# Patient Record
Sex: Female | Born: 1985 | Hispanic: No | Marital: Married | Smoking: Never smoker
Health system: Southern US, Community
[De-identification: ages and names within clinical notes are randomized; demographics above are authoritative.]

## PROBLEM LIST (undated history)

## (undated) ENCOUNTER — Inpatient Hospital Stay (HOSPITAL_COMMUNITY): Payer: Self-pay

## (undated) DIAGNOSIS — O4690 Antepartum hemorrhage, unspecified, unspecified trimester: Secondary | ICD-10-CM

## (undated) HISTORY — PX: WISDOM TOOTH EXTRACTION: SHX21

---

## 2007-11-24 HISTORY — PX: LEEP: SHX91

## 2014-09-12 LAB — OB RESULTS CONSOLE HIV ANTIBODY (ROUTINE TESTING): HIV: NONREACTIVE

## 2014-09-12 LAB — OB RESULTS CONSOLE HEPATITIS B SURFACE ANTIGEN: Hepatitis B Surface Ag: NEGATIVE

## 2014-09-12 LAB — OB RESULTS CONSOLE RUBELLA ANTIBODY, IGM: Rubella: IMMUNE

## 2014-09-26 LAB — OB RESULTS CONSOLE GC/CHLAMYDIA
Chlamydia: NEGATIVE
Gonorrhea: NEGATIVE

## 2014-11-10 ENCOUNTER — Inpatient Hospital Stay (HOSPITAL_COMMUNITY): Payer: 59

## 2014-11-10 ENCOUNTER — Encounter (HOSPITAL_COMMUNITY): Payer: Self-pay | Admitting: Obstetrics and Gynecology

## 2014-11-10 ENCOUNTER — Inpatient Hospital Stay (HOSPITAL_COMMUNITY)
Admission: AD | Admit: 2014-11-10 | Discharge: 2014-11-10 | Disposition: A | Discharge status: Home / Self Care | Payer: 59 | Source: Ambulatory Visit | Attending: Obstetrics & Gynecology | Admitting: Obstetrics & Gynecology

## 2014-11-10 DIAGNOSIS — Z3A15 15 weeks gestation of pregnancy: Secondary | ICD-10-CM | POA: Insufficient documentation

## 2014-11-10 DIAGNOSIS — O4412 Placenta previa with hemorrhage, second trimester: Secondary | ICD-10-CM | POA: Insufficient documentation

## 2014-11-10 DIAGNOSIS — O4402 Placenta previa specified as without hemorrhage, second trimester: Secondary | ICD-10-CM | POA: Insufficient documentation

## 2014-11-10 DIAGNOSIS — O4690 Antepartum hemorrhage, unspecified, unspecified trimester: Secondary | ICD-10-CM

## 2014-11-10 DIAGNOSIS — O209 Hemorrhage in early pregnancy, unspecified: Secondary | ICD-10-CM | POA: Diagnosis present

## 2014-11-10 HISTORY — DX: Antepartum hemorrhage, unspecified, unspecified trimester: O46.90

## 2014-11-10 LAB — CBC
HCT: 39 % (ref 36.0–46.0)
Hemoglobin: 13.7 g/dL (ref 12.0–15.0)
MCH: 30.4 pg (ref 26.0–34.0)
MCHC: 35.1 g/dL (ref 30.0–36.0)
MCV: 86.7 fL (ref 78.0–100.0)
PLATELETS: 197 10*3/uL (ref 150–400)
RBC: 4.5 MIL/uL (ref 3.87–5.11)
RDW: 13.1 % (ref 11.5–15.5)
WBC: 7 10*3/uL (ref 4.0–10.5)

## 2014-11-10 LAB — ABO/RH: ABO/RH(D): B NEG

## 2014-11-10 MED ORDER — RHO D IMMUNE GLOBULIN 1500 UNIT/2ML IJ SOSY
300.0000 ug | PREFILLED_SYRINGE | Freq: Once | INTRAMUSCULAR | Status: AC
  Administered 2014-11-10: 300 ug via INTRAMUSCULAR
  Filled 2014-11-10: qty 2

## 2014-11-10 NOTE — Discharge Instructions (Signed)
Vaginal Bleeding During Pregnancy, Second Trimester  A small amount of bleeding (spotting) from the vagina is common in pregnancy. Sometimes the bleeding is normal and is not a problem, and sometimes it is a sign of something serious. Be sure to tell your doctor about any bleeding from your vagina right away. HOME CARE 1. Watch your condition for any changes. 2. Follow your doctor's instructions about how active you can be. 3. If you are on bed rest: 1. You may need to stay in bed and only get up to use the bathroom. 2. You may be allowed to do some activities. 3. If you need help, make plans for someone to help you. 4. Write down: 1. The number of pads you use each day. 2. How often you change pads. 3. How soaked (saturated) your pads are. 5. Do not use tampons. 6. Do not douche. 7. Do not have sex or orgasms until your doctor says it is okay. 8. If you pass any tissue from your vagina, save the tissue so you can show it to your doctor. 9. Only take medicines as told by your doctor. 10. Do not take aspirin because it can make you bleed. 11. Do not exercise, lift heavy weights, or do any activities that take a lot of energy and effort unless your doctor says it is okay. 12. Keep all follow-up visits as told by your doctor. GET HELP IF:   You bleed from your vagina.  You have cramps.  You have labor pains.  You have a fever that does not go away after you take medicine. GET HELP RIGHT AWAY IF:  You have very bad cramps in your back or belly (abdomen).  You have contractions.  You have chills.  You pass large clots or tissue from your vagina.  You bleed more.  You feel light-headed or weak.  You pass out (faint).  You are leaking fluid or have a gush of fluid from your vagina. MAKE SURE YOU:  Understand these instructions.  Will watch your condition.  Will get help right away if you are not doing well or get worse. Document Released: 03/26/2014 Document Reviewed:  07/17/2013 [facility] Patient Information 2015 [place]. This information is not intended to replace advice given to you by your health care provider. Make sure you discuss any questions you have with your health care provider.  Pelvic Rest Pelvic rest is sometimes recommended for women when:  13. The placenta is partially or completely covering the opening of the cervix (placenta previa). 14. There is bleeding between the uterine wall and the amniotic sac in the first trimester (subchorionic hemorrhage). 15. The cervix begins to open without labor starting (incompetent cervix, cervical insufficiency). 76. The labor is too early (preterm labor). HOME CARE INSTRUCTIONS  Do not have sexual intercourse, stimulation, or an orgasm.  Do not use tampons, douche, or put anything in the vagina.  Do not lift anything over 10 pounds (4.5 kg).  Avoid strenuous activity or straining your pelvic muscles. SEEK MEDICAL CARE IF:  You have any vaginal bleeding during pregnancy. Treat this as a potential emergency.  You have cramping pain felt low in the stomach (stronger than menstrual cramps).  You notice vaginal discharge (watery, mucus, or bloody).  You have a low, dull backache.  There are regular contractions or uterine tightening. SEEK IMMEDIATE MEDICAL CARE IF: You have vaginal bleeding and have placenta previa.  Document Released: 03/06/2011 Document Revised: 02/01/2012 Document Reviewed: 03/06/2011 Exit[employer] Patient Information 2015  [employer]. This information is not intended to replace advice given to you by your health care provider. Make sure you discuss any questions you have with your health care provider.  Placenta Previa  Placenta previa is a condition in pregnant women where the placenta implants in the lower part of the uterus. The placenta either partially or completely covers the opening to the cervix. This is a problem because the baby must pass through the cervix  during delivery. There are three types of placenta previa. They include:  17. Marginal placenta previa. The placenta is near the cervix, but does not cover the opening. 18. Partial placenta previa. The placenta covers part of the cervical opening. 19. Complete placenta previa. The placenta covers the entire cervical opening.  Depending on the type of placenta previa, there is a chance the placenta may move into a normal position and no longer cover the cervix as the pregnancy progresses. It is important to keep all prenatal visits with your caregiver.  RISK FACTORS You may be more likely to develop placenta previa if you:   Are carrying more than one baby (multiples).   Have an abnormally shaped uterus.   Have scars on the lining of the uterus.   Had previous surgeries involving the uterus, such as a cesarean delivery.   Have delivered a baby previously.   Have a history of placenta previa.   Have smoked or used cocaine during pregnancy.   Are age 31 or older during pregnancy.  SYMPTOMS The main symptom of placenta previa is sudden, painless vaginal bleeding during the second half of pregnancy. The amount of bleeding can be light to very heavy. The bleeding may stop on its own, but almost always returns. Cramping, regular contractions, abdominal pain, and lower back pain can also occur with placenta previa.  DIAGNOSIS Placenta previa can be diagnosed through an ultrasound by finding where the placenta is located. The ultrasound may find placenta previa either during a routine prenatal visit or after vaginal bleeding is noticed. If you are diagnosed with placenta previa, your caregiver may avoid vaginal exams to reduce the risk of heavy bleeding. There is a chance that placenta previa may not be diagnosed until bleeding occurs during labor.  TREATMENT Specific treatment depends on:   How much you are bleeding or if the bleeding has stopped.  How far along you are in your  pregnancy.   The condition of the baby.   The location of the baby and placenta.   The type of placenta previa.  Depending on the factors above, your caregiver may recommend:   Decreased activity.   Bed rest at home or in the hospital.  Pelvic rest. This means no sex, using tampons, douching, pelvic exams, or placing anything into the vagina.  A blood transfusion to replace maternal blood loss.  A cesarean delivery if the bleeding is heavy and cannot be controlled or the placenta completely covers the cervix.  Medication to stop premature labor or mature the fetal lungs if delivery is needed before the pregnancy is full term.  WHEN SHOULD YOU SEEK IMMEDIATE MEDICAL CARE IF YOU ARE SENT HOME WITH PLACENTA PREVIA? Seek immediate medical care if you show any symptoms of placenta previa. You will need to go to the hospital to get checked immediately. Again, those symptoms are:  Sudden, painless vaginal bleeding, even a small amount.  Cramping or regular contractions.  Lower back or abdominal pain. Document Released: 11/09/2005 Document Revised: 07/12/2013 Document Reviewed: 02/10/2013  Exit[employer]® Patient Information ©2015 [employer]. This information is not intended to replace advice given to you by your health care provider. Make sure you discuss any questions you have with your health care provider. ° °

## 2014-11-10 NOTE — MAU Note (Signed)
Pt states here for heavy bleeding that began 1 hour ago. Was at office this past week, has 2nd u/s, fht's heard. Began bleeding during intercourse. Denies pain. Unsure if still bleeding at present.

## 2014-11-10 NOTE — MAU Provider Note (Signed)
History     CSN: [id number]  Arrival date and time: 11/10/14 9211  Provider on unit: 0945 Provider at bedside: 1018     Chief Complaint  Patient presents with  . Vaginal Bleeding   HPI  [name] is a 28 yo G1P0 at 15.[redacted] wks gestation presenting with bright, red VB during intercourse.  She has a known history of complete previa that she thought was just "low-lying and would be monitored". Her blood type is B negative (per [facility] records) and will need Rhophylac injection.  Her prenatal hx is significant for Rh Negative and complete placenta previa.  She has had a normal Ultrascreen and NT sono.  Her primary OB at [facility] is Dr. Benjie Dickson.  Past Medical History  Diagnosis Date  . Vaginal bleeding during pregnancy, antepartum 11/10/2014    Past Surgical History  Procedure Laterality Date  . Leep  2009    History reviewed. No pertinent family history.  History  Substance Use Topics  . Smoking status: Never Smoker   . Smokeless tobacco: Never Used  . Alcohol Use: No    Allergies: No Known Allergies  No prescriptions prior to admission    Review of Systems  Constitutional: Negative.   HENT: Negative.   Eyes: Negative.   Respiratory: Negative.   Cardiovascular: Negative.   Gastrointestinal: Negative.   Genitourinary:       Vaginal bleeding during intercourse; decreased in amount since being in hospital  Musculoskeletal: Negative.   Skin: Negative.   Neurological: Negative.   Endo/Heme/Allergies: Negative.   Psychiatric/Behavioral: Negative.    Results for orders placed or performed during the hospital encounter of 11/10/14 (from the past 24 hour(s))  CBC     Status: None   Collection Time: 11/10/14  9:05 AM  Result Value Ref Range   WBC 7.0 4.0 - 10.5 K/uL   RBC 4.50 3.87 - 5.11 MIL/uL   Hemoglobin 13.7 12.0 - 15.0 g/dL   HCT 39.0 36.0 - 46.0 %   MCV 86.7 78.0 - 100.0 fL   MCH 30.4 26.0 - 34.0 pg   MCHC 35.1 30.0 - 36.0 g/dL   RDW 13.1 11.5 - 15.5 %   Platelets 197 150 - 400 K/uL  Rh IG workup (includes ABO/Rh)     Status: None (Preliminary result)   Collection Time: 11/10/14  9:05 AM  Result Value Ref Range   Gestational Age(Wks) 15.2    ABO/RH(D) B NEG    Antibody Screen NEG    Unit Number [id number]    Blood Component Type RHIG    Unit division 00    Status of Unit ISSUED    Transfusion Status OK TO TRANSFUSE    OB Limited Ultrasound (transcribed from preliminary report) 15.2 wks SIUP with FHR of 144 bpm, anterior placenta previa - no abruption seen, AFI WNL, CL 4.09 cm, fetal position: transverse to maternal RT  Physical Exam   Blood pressure 107/67, pulse 62, temperature 98.4 F (36.9 C), temperature source Oral, resp. rate 16, height 5\' 10"  (1.778 m), weight 65.488 kg (144 lb 6 oz).  Physical Exam  Constitutional: She is oriented to person, place, and time. She appears well-developed and well-nourished.  HENT:  Head: Normocephalic.  Eyes: Pupils are equal, round, and reactive to light.  Neck: Normal range of motion.  Cardiovascular: Normal rate, regular rhythm and normal heart sounds.   Respiratory: Effort normal and breath sounds normal.  GI: Soft. Bowel sounds are normal.  Genitourinary:  SSE: Small amount  of brownish, red blood in vaginal vault; blood gently removed with large swab; VE deferred d/t complete previa  Musculoskeletal: Normal range of motion.  Neurological: She is alert and oriented to person, place, and time. She has normal reflexes.  Skin: Skin is warm and dry.  Psychiatric: She has a normal mood and affect. Her behavior is normal. Judgment and thought content normal.    MAU Course  Procedures Fetal Heart Tones CBC OB Limited Ultrasound Rhophylac Work-up/Injection Assessment and Plan  28 yo G1P0 @ 15.[redacted] wks gestation Placenta Previa, second trimester Vaginal Bleeding in Pregnancy  Discharge Home Pelvic Rest until seen by Dr. Cranston Dickson to exercise / stop if bleeding or cramping occurs Call  the office for any bleeding or pain Keep scheduled appointment with Dr. Benjie Dickson 11/28/2014   Laury Deep, M MSN, CNM 11/10/2014, 11:48 AM

## 2014-11-11 DIAGNOSIS — O4402 Placenta previa specified as without hemorrhage, second trimester: Secondary | ICD-10-CM | POA: Insufficient documentation

## 2014-11-11 LAB — RH IG WORKUP (INCLUDES ABO/RH)
ABO/RH(D): B NEG
ANTIBODY SCREEN: NEGATIVE
Gestational Age(Wks): 15.2
Unit division: 0

## 2014-11-23 NOTE — L&D Delivery Note (Signed)
Delivery Note At 9:41 AM a viable and healthy female was delivered via Vaginal, Spontaneous Delivery (Presentation: Right Occiput Anterior).  APGAR: 9, 9; weight  -pending    Placenta status: Intact, Spontaneous.  Cord: 3 vessels with the following complications: None.  Cord pH: N/A  Anesthesia: Epidural  Episiotomy: None Lacerations: Labial Suture Repair: 3.0 vicryl rapide Est. Blood Loss (mL):  30 cc  Mom to postpartum.  Baby to Couplet care / Skin to Skin.  Amro Winebarger R 04/16/2015, 10:07 AM

## 2015-01-04 ENCOUNTER — Encounter: Payer: Self-pay | Admitting: Internal Medicine

## 2015-01-04 ENCOUNTER — Ambulatory Visit (INDEPENDENT_AMBULATORY_CARE_PROVIDER_SITE_OTHER): Payer: 59 | Admitting: Internal Medicine

## 2015-01-04 VITALS — Ht 70.0 in | Wt 154.0 lb

## 2015-01-04 DIAGNOSIS — R42 Dizziness and giddiness: Secondary | ICD-10-CM

## 2015-01-04 NOTE — Progress Notes (Signed)
Cardiology Office Note   Date:  01/04/2015   ID:  Dawn Dickson, DOB October 29, 1986, MRN [id number]  PCP:  [facility]  Cardiologist:   Dorris Carnes, MD   Patient is a 29 yo who presents for evaluation of reported "syncope"    History of Present Illness: Dawn Dickson is a 28 y.o. female who is [redacted] wks pregnant.  In first trimester she had problems with morning sickness.  One morning she was exercising at [place] started to tunnel  Saw stars  Moved from where she was to go lean against wall  Did not pass out  Denied palpitations.  Did fine after  Last week had episode of ankle edema  Denies change in fluid or salt intake prior  Resolved on own over 3 days    Was seen by OB  BP 130s/    Urine neg for protein  Blood work done   [employer] AM got up  Had breakfast  Went to 7:3- AM meeting  Was sitting  Felt SOB for about a min  Had some epigastric pain.  Then develop lightheadedness  Vision dimmed  Told person sitting next to her that she felt like she was going to pass out  Got up  Went to side and back of room Couldn't see things for a bit  Did not pass out  Went to OB later  BP was 96/48    Patient is a Chief Executive Officer  Works mainly at [employer] that she drinks fluids   Has been feeling good at other times    When young had some dizzy spells with quick standing  Saw "spots" in past  Has never passed out.  Has also had vertigo which is different         Current Outpatient Prescriptions  Medication Sig Dispense Refill  . folic acid (FOLVITE) 188 MCG tablet Take 400 mcg by mouth daily.    . Omega-3 Fatty Acids (FISH OIL PO) Take by mouth daily.    . Prenat w/o A-FeCbGl-DSS-FA-DHA (CITRANATAL 90 DHA) 90-1 & 300 MG MISC as needed.  11   No current facility-administered medications for this visit.    Allergies:   Review of patient's allergies indicates no known allergies.   Past Medical History  Diagnosis Date  . Vaginal bleeding during pregnancy,  antepartum 11/10/2014    Past Surgical History  Procedure Laterality Date  . Leep  2009     Social History:  The patient  reports that she has never smoked. She has never used smokeless tobacco. She reports that she does not drink alcohol or use illicit drugs.   Family History:  The patient's family history is not on file.    ROS:  Please see the history of present illness. All other systems are reviewed and  Negative to the above problem except as noted.    PHYSICAL EXAM: VS:  Ht 5\' 10"  (1.778 m)  Wt 154 lb (69.854 kg)  BMI 22.10 kg/m2  SpO2 97%  GEN: Well nourished, well developed, in no acute distress HEENT: normal Neck: no JVD, carotid bruits, or masses Cardiac: RRR; no murmurs, rubs, or gallops,no edema  Respiratory:  clear to auscultation bilaterally, normal work of breathing GI: soft, nontender, nondistended, + BS  No hepatomegaly  MS: no deformity Moving all extremities   Skin: warm and dry, no rash Neuro:  Strength and sensation are intact Psych: euthymic mood, full affect  EKG:  EKG is ordered today.  SR 70 bpm     Lipid Panel No results found for: CHOL, TRIG, HDL, CHOLHDL, VLDL, LDLCALC, LDLDIRECT    Wt Readings from Last 3 Encounters:  01/04/15 154 lb (69.854 kg)  11/10/14 144 lb 6 oz (65.488 kg)      ASSESSMENT AND PLAN  Dizziness  Patinet has not had syncope.  She has had 2 spells now of severe dizziness since pregnant.   On check today she is not orthostatic. I do think she has a tendency towards this though based on her history   Cardiac exam is normal  No edema EKG is normal  I would recomm that she increase her fluid and some salt intake  She needs to take activities as tolerated.  I would encourage exercise but not with excess sweating.  Again, hydrate   When at works she needs to elevate legs, change position.  Try to cut back on some of demands if possible.  IN regards to edema it was self limited  Sounds like testing was negative  Follow   No testing for now.    Will be available as needed  I have given patient my number so she can call if problems present  Overall I thinks she and the baby should do OK     Signed, Dorris Carnes, MD  01/04/2015 4:37 PM    [address], [place], [place]  [address] Phone: [phone]; Fax: [phone]

## 2015-01-04 NOTE — Patient Instructions (Signed)
Your physician recommends that you continue on your current medications as directed. Please refer to the Current Medication list given to you today.     

## 2015-01-24 ENCOUNTER — Telehealth: Payer: Self-pay | Admitting: Internal Medicine

## 2015-01-24 NOTE — Telephone Encounter (Signed)
Left msg on voicemail Seen on 2/12   Called to see how she was feeling .   Told her to call back

## 2015-02-14 LAB — OB RESULTS CONSOLE RPR: RPR: NONREACTIVE

## 2015-04-01 ENCOUNTER — Inpatient Hospital Stay (HOSPITAL_COMMUNITY): Payer: 59

## 2015-04-01 ENCOUNTER — Inpatient Hospital Stay (HOSPITAL_COMMUNITY)
Admission: AD | Admit: 2015-04-01 | Discharge: 2015-04-02 | DRG: 778 | Disposition: A | Discharge status: Home / Self Care | Payer: 59 | Source: Ambulatory Visit | Attending: Obstetrics | Admitting: Obstetrics

## 2015-04-01 ENCOUNTER — Encounter (HOSPITAL_COMMUNITY): Payer: Self-pay | Admitting: *Deleted

## 2015-04-01 DIAGNOSIS — Z87440 Personal history of urinary (tract) infections: Secondary | ICD-10-CM | POA: Diagnosis not present

## 2015-04-01 DIAGNOSIS — N1330 Unspecified hydronephrosis: Secondary | ICD-10-CM | POA: Diagnosis present

## 2015-04-01 DIAGNOSIS — O9989 Other specified diseases and conditions complicating pregnancy, childbirth and the puerperium: Secondary | ICD-10-CM | POA: Diagnosis present

## 2015-04-01 DIAGNOSIS — O4693 Antepartum hemorrhage, unspecified, third trimester: Secondary | ICD-10-CM | POA: Diagnosis present

## 2015-04-01 DIAGNOSIS — O4690 Antepartum hemorrhage, unspecified, unspecified trimester: Secondary | ICD-10-CM

## 2015-04-01 DIAGNOSIS — Z3A35 35 weeks gestation of pregnancy: Secondary | ICD-10-CM | POA: Diagnosis present

## 2015-04-01 DIAGNOSIS — N86 Erosion and ectropion of cervix uteri: Secondary | ICD-10-CM | POA: Diagnosis present

## 2015-04-01 DIAGNOSIS — O3433 Maternal care for cervical incompetence, third trimester: Secondary | ICD-10-CM | POA: Diagnosis present

## 2015-04-01 LAB — COMPREHENSIVE METABOLIC PANEL
ALT: 14 U/L (ref 14–54)
AST: 22 U/L (ref 15–41)
Albumin: 2.9 g/dL — ABNORMAL LOW (ref 3.5–5.0)
Alkaline Phosphatase: 216 U/L — ABNORMAL HIGH (ref 38–126)
Anion gap: 6 (ref 5–15)
BUN: <5 mg/dL — ABNORMAL LOW (ref 6–20)
CO2: 23 mmol/L (ref 22–32)
Calcium: 8.7 mg/dL — ABNORMAL LOW (ref 8.9–10.3)
Chloride: 104 mmol/L (ref 101–111)
Creatinine, Ser: 0.51 mg/dL (ref 0.44–1.00)
GFR calc Af Amer: >60 mL/min (ref >60)
GFR calc non Af Amer: >60 mL/min (ref >60)
Glucose, Bld: 105 mg/dL — ABNORMAL HIGH (ref 70–99)
Potassium: 3.9 mmol/L (ref 3.5–5.1)
Sodium: 133 mmol/L — ABNORMAL LOW (ref 135–145)
Total Bilirubin: 0.6 mg/dL (ref 0.3–1.2)
Total Protein: 5.8 g/dL — ABNORMAL LOW (ref 6.5–8.1)

## 2015-04-01 LAB — CBC
HCT: 38.2 % (ref 36.0–46.0)
Hemoglobin: 13.1 g/dL (ref 12.0–15.0)
MCH: 29 pg (ref 26.0–34.0)
MCHC: 34.3 g/dL (ref 30.0–36.0)
MCV: 84.5 fL (ref 78.0–100.0)
Platelets: 202 10*3/uL (ref 150–400)
RBC: 4.52 MIL/uL (ref 3.87–5.11)
RDW: 14.7 % (ref 11.5–15.5)
WBC: 9 10*3/uL (ref 4.0–10.5)

## 2015-04-01 LAB — PROTIME-INR
INR: 1.01 (ref 0.00–1.49)
Prothrombin Time: 13.4 seconds (ref 11.6–15.2)

## 2015-04-01 LAB — OB RESULTS CONSOLE GBS: GBS: NEGATIVE

## 2015-04-01 LAB — SAVE SMEAR

## 2015-04-01 LAB — FIBRINOGEN: Fibrinogen: 515 mg/dL — ABNORMAL HIGH (ref 204–475)

## 2015-04-01 LAB — APTT: aPTT: 28 seconds (ref 24–37)

## 2015-04-01 MED ORDER — FENTANYL 2.5 MCG/ML BUPIVACAINE 1/10 % EPIDURAL INFUSION (WH - ANES): 14.0000 mL/h | INTRAMUSCULAR | Status: DC | PRN

## 2015-04-01 MED ORDER — DIPHENHYDRAMINE HCL 50 MG/ML IJ SOLN: 12.5000 mg | INTRAMUSCULAR | Status: DC | PRN

## 2015-04-01 MED ORDER — LIDOCAINE HCL (PF) 1 % IJ SOLN: 30.0000 mL | INTRAMUSCULAR | Status: DC | PRN

## 2015-04-01 MED ORDER — OXYCODONE-ACETAMINOPHEN 5-325 MG PO TABS
2.0000 | ORAL_TABLET | ORAL | Status: DC | PRN
Start: 2015-04-01 — End: 2015-04-02

## 2015-04-01 MED ORDER — LACTATED RINGERS IV SOLN: INTRAVENOUS | Status: DC

## 2015-04-01 MED ORDER — LACTATED RINGERS IV SOLN: 500.0000 mL | INTRAVENOUS | Status: DC | PRN

## 2015-04-01 MED ORDER — EPHEDRINE 5 MG/ML INJ: 10.0000 mg | INTRAVENOUS | Status: DC | PRN

## 2015-04-01 MED ORDER — BETAMETHASONE SOD PHOS & ACET 6 (3-3) MG/ML IJ SUSP
12.0000 mg | Freq: Once | INTRAMUSCULAR | Status: AC
  Administered 2015-04-01: 12 mg via INTRAMUSCULAR
  Filled 2015-04-01: qty 2

## 2015-04-01 MED ORDER — ZOLPIDEM TARTRATE 5 MG PO TABS
5.0000 mg | ORAL_TABLET | Freq: Every day | ORAL | Status: DC
Start: 2015-04-01 — End: 2015-04-02
  Administered 2015-04-01: 5 mg via ORAL
  Filled 2015-04-01: qty 1

## 2015-04-01 MED ORDER — ACETAMINOPHEN 325 MG PO TABS: 650.0000 mg | ORAL_TABLET | ORAL | Status: DC | PRN

## 2015-04-01 MED ORDER — PHENYLEPHRINE 40 MCG/ML (10ML) SYRINGE FOR IV PUSH (FOR BLOOD PRESSURE SUPPORT): 80.0000 ug | PREFILLED_SYRINGE | INTRAVENOUS | Status: DC | PRN

## 2015-04-01 MED ORDER — CITRIC ACID-SODIUM CITRATE 334-500 MG/5ML PO SOLN: 30.0000 mL | ORAL | Status: DC | PRN

## 2015-04-01 MED ORDER — OXYCODONE-ACETAMINOPHEN 5-325 MG PO TABS: 1.0000 | ORAL_TABLET | ORAL | Status: DC | PRN

## 2015-04-01 MED ORDER — OXYTOCIN 40 UNITS IN LACTATED RINGERS INFUSION - SIMPLE MED: 62.5000 mL/h | INTRAVENOUS | Status: DC

## 2015-04-01 MED ORDER — OXYTOCIN BOLUS FROM INFUSION: 500.0000 mL | INTRAVENOUS | Status: DC

## 2015-04-01 NOTE — MAU Note (Signed)
Pt returned from ambulating after about 5 minutes and stated that she had some bleeding.  Passed a small clot and had blood on tissue when she wiped and on the inside of both thighs.  Put back on the monitor and informed provider.

## 2015-04-01 NOTE — H&P (Signed)
  OB ADMISSION/ HISTORY & PHYSICAL:  Admission Date: 04/01/2015 11:36 AM  Admit Diagnosis: 35.4 weeks  / late preterm labor / third trimester bleeding  Priscillia Fouch is a 29 y.o. female presenting for questionable labor with progressive cervical change since Friday  / new onset red bleeding today  Prenatal History: G1P0   EDC : 05/02/2015, Date entered prior to episode creation  Prenatal care at [facility]  Primary Ob Provider: [facility] Prenatal course complicated by hx LEEP / antepartum bleeding episode r/t early previa (resolved) / anterior previa - resolved third trimester  / short cervix with funneling @ 34 weeks  Prenatal Labs: ABO, Rh: --/--/B NEG, B NEG (12/19 0905) Antibody: NEG (12/19 0905) Rubella:   Immune RPR:   NR HBsAg:   Negative HIV:   NR GTT: NL GBS:   GBS  Urine TOC last week - negative  Medical / Surgical History : Past medical history:  Past Medical History  Diagnosis Date  . Vaginal bleeding during pregnancy, antepartum 11/10/2014    Past surgical history:  Past Surgical History  Procedure Laterality Date  . Leep  2009   Family History: History reviewed. No pertinent family history.   Social History:  reports that she has never smoked. She has never used smokeless tobacco. She reports that she does not drink alcohol or use illicit drugs.  Allergies: Review of patient's allergies indicates no known allergies.   Current Medications at time of admission:  Prior to Admission medications   Medication Sig Start Date End Date Taking? Authorizing Provider  Prenat w/o A-FeCbGl-DSS-FA-DHA (CITRANATAL 90 DHA) 90-1 & 300 MG MISC as needed. 10/10/14  Yes Historical Provider, MD  folic acid (FOLVITE) 638 MCG tablet Take 400 mcg by mouth daily.    Historical Provider, MD  Omega-3 Fatty Acids (FISH OIL PO) Take by mouth daily.    Historical Provider, MD   Review of Systems: Active FM onset of ctx currently every 3-5 minutes No LOF bloody show  present  Physical Exam: VS: Blood pressure 109/72, pulse 84, temperature 97.9 F (36.6 C), temperature source Oral, resp. rate 20, height 5' 8.5" (1.74 m), weight 73.483 kg (162 lb).  General: alert and oriented, appears comfortable Heart: RRR Lungs: Clear lung fields Abdomen: Gravid, soft and non-tender, non-distended / uterus: gravid - non-tender Extremities: no edema  Genitalia / VE:   in office 3cm / 70% / vtx / 0 station                                         4-5cm / 90% / vtx  +1 BBOW  FHR: baseline rate 145 / variability moderate / accelerations + / no decelerations TOCO: ctx Q 3-5  Assessment: 35.[redacted] weeks gestation - negative GBS Pre-term labor Third trimester bleeding - likely cervical progression / cannot exclude placental etiology FHR category 1   Plan:  Admit sono to assess placenta DIC labs with antibody screen and type&screen Initial triage orders with Dr Garwin Brothers  Dr Pamala Hurry notified of admission / plan of care with progressive cervical change  Artelia Laroche CNM, MSN, [facility] 04/01/2015, 1:13 PM

## 2015-04-01 NOTE — MAU Note (Signed)
Urine in lab 

## 2015-04-01 NOTE — MAU Note (Addendum)
PT  CAME FROM  OFFICE  FOR  LABOR  EVAL-  3CM  80  %  - GBS-  NEG

## 2015-04-01 NOTE — Progress Notes (Addendum)
Lab reports pt has positive antibody--provider notified no new orders

## 2015-04-01 NOTE — MAU Note (Signed)
29 yo G1P0 MWF @ 35 4/7 weeks hx LEEP presents for further evaluation due to cervical dilation noted in office today. GBS cx neg. ucx neg from 5/6. Last ate an apple Per Rolitta, VE 3/80/0 Tracing: baseline 150 reactive Ctx q 3-4 mins( rate 1/10)  IMP: latent phase/preterm labor @ 35 4/7 weeks Pt and husband advised would not stop labor due to gest age. Recommend ambulating and recheck cervix in one hour by Artelia Laroche, CNM Advised to d/c macrobid. Will get labor parameters prior to going home All ? answered

## 2015-04-01 NOTE — Progress Notes (Signed)
S: Pt notes still with contractions, slightly stronger but less frequent than earlier. No LOF but occ dark blood and increased mucous. Good FM.  O: Gen: well appearing Abd: gravid, fundus NT GU: cvx 4cm/80%/ vtx 0/ membranes intact/ mid position/ no active bleeding LE: NT, no edema Toco: q 4-7 min FH: 130s, + accles, no decels, 10 breat var  CBC    Component Value Date/Time   WBC 9.0 04/01/2015 1439   RBC 4.52 04/01/2015 1439   HGB 13.1 04/01/2015 1439   HCT 38.2 04/01/2015 1439   PLT 202 04/01/2015 1439   MCV 84.5 04/01/2015 1439   MCH 29.0 04/01/2015 1439   MCHC 34.3 04/01/2015 1439   RDW 14.7 04/01/2015 1439    nl DIC panel U/s: no evidence previa  A/P: 29 yo G1 at 35'4 admitted for PTL, no tocolysis given, late preterm BMZ given as no evidence chorio, no further cervical change through the day. H/o LEEP and cervical shortening/ funnelling over the last few wks. DDx: arrested PTL vs cervical incompletence with 3rd trimester vaginal bleeding likely due to cervical change. D/w pt would not tocolyze and would not augment at this point. Fetal testing remains reactive. Will treat as arrested PTL at this point, watch over night and repeat cervical exam in am. Pt understands plan.  Rhian Funari A. 04/01/2015 7:37 PM

## 2015-04-01 NOTE — MAU Note (Signed)
Sent from office for further eval , pt contracting.  Was 3/80.

## 2015-04-02 LAB — RPR: RPR Ser Ql: NONREACTIVE

## 2015-04-02 MED ORDER — BETAMETHASONE SOD PHOS & ACET 6 (3-3) MG/ML IJ SUSP
12.0000 mg | Freq: Once | INTRAMUSCULAR | Status: AC
  Administered 2015-04-02: 12 mg via INTRAMUSCULAR
  Filled 2015-04-02: qty 2

## 2015-04-02 NOTE — Discharge Instructions (Signed)
Pelvic Rest Pelvic rest is sometimes recommended for women when:   The placenta is partially or completely covering the opening of the cervix (placenta previa).  There is bleeding between the uterine wall and the amniotic sac in the first trimester (subchorionic hemorrhage).  The cervix begins to open without labor starting (incompetent cervix, cervical insufficiency).  The labor is too early (preterm labor). HOME CARE INSTRUCTIONS  Do not have sexual intercourse, stimulation, or an orgasm.  Do not use tampons, douche, or put anything in the vagina.  Do not lift anything over 10 pounds (4.5 kg).  Avoid strenuous activity or straining your pelvic muscles. SEEK MEDICAL CARE IF:  You have any vaginal bleeding during pregnancy. Treat this as a potential emergency.  You have cramping pain felt low in the stomach (stronger than menstrual cramps).  You notice vaginal discharge (watery, mucus, or bloody).  You have a low, dull backache.  There are regular contractions or uterine tightening. SEEK IMMEDIATE MEDICAL CARE IF: You have vaginal bleeding and have placenta previa.  Document Released: 03/06/2011 Document Revised: 02/01/2012 Document Reviewed: 03/06/2011 [facility] Patient Information 2015 [place], [place]. This information is not intended to replace advice given to you by your health care provider. Make sure you discuss any questions you have with your health care provider.  Vaginal Bleeding During Pregnancy, Third Trimester A small amount of bleeding (spotting) from the vagina is relatively common in pregnancy. Various things can cause bleeding or spotting in pregnancy. Sometimes the bleeding is normal and is not a problem. However, bleeding during the third trimester can also be a sign of something serious for the mother and the baby. Be sure to tell your health care provider about any vaginal bleeding right away.  Some possible causes of vaginal bleeding during the third  trimester include:   The placenta may be partially or completely covering the opening to the cervix (placenta previa).   The placenta may have separated from the uterus (abruption of the placenta).   There may be an infection or growth on the cervix.   You may be starting labor, called discharging of the mucus plug.   The placenta may grow into the muscle layer of the uterus (placenta accreta).  HOME CARE INSTRUCTIONS  Watch your condition for any changes. The following actions may help to lessen any discomfort you are feeling:   Follow your health care provider's instructions for limiting your activity. If your health care provider orders bed rest, you may need to stay in bed and only get up to use the bathroom. However, your health care provider may allow you to continue light activity.  If needed, make plans for someone to help with your regular activities and responsibilities while you are on bed rest.  Keep track of the number of pads you use each day, how often you change pads, and how soaked (saturated) they are. Write this down.  Do not use tampons. Do not douche.  Do not have sexual intercourse or orgasms until approved by your health care provider.  Follow your health care provider's advice about lifting, driving, and physical activities.  If you pass any tissue from your vagina, save the tissue so you can show it to your health care provider.   Only take over-the-counter or prescription medicines as directed by your health care provider.  Do not take aspirin because it can make you bleed.   Keep all follow-up appointments as directed by your health care provider. SEEK MEDICAL CARE IF:  You have any vaginal bleeding during any part of your pregnancy.  You have cramps or labor pains.  You have a fever, not controlled by medicine. SEEK IMMEDIATE MEDICAL CARE IF:   You have severe cramps or pain in your back or belly (abdomen).  You have chills.  You have a  gush of fluid from the vagina.  You pass large clots or tissue from your vagina.  Your bleeding increases.  You feel light-headed or weak.  You pass out.  You feel less movement or no movement of the baby.  MAKE SURE FXT:[id number] Labor Information Preterm labor is when labor starts at less than 37 weeks of pregnancy. The normal length of a pregnancy is 39 to 41 weeks. CAUSES Often, there is no identifiable underlying cause as to why a woman goes into preterm labor. One of the most common known causes of preterm labor is infection. Infections of the uterus, cervix, vagina, amniotic sac, bladder, kidney, or even the lungs (pneumonia) can cause labor to start. Other suspected causes of preterm labor include:  Urogenital infections, such as yeast infections and bacterial vaginosis.  Uterine abnormalities (uterine shape, uterine septum, fibroids, or bleeding from the placenta).  A cervix that has been operated on (it may fail to stay closed).  Malformations in the fetus.  Multiple gestations (twins, triplets, and so on).  Breakage of the amniotic sac.  RISK FACTORS Having a previous history of preterm labor.  Having premature rupture of membranes (PROM).  Having a placenta that covers the opening of the cervix (placenta previa).  Having a placenta that separates from the uterus (placental abruption).  Having a cervix that is too weak to hold the fetus in the uterus (incompetent cervix).  Having too much fluid in the amniotic sac (polyhydramnios).  Taking illegal drugs or smoking while pregnant.  Not gaining enough weight while pregnant.  Being younger than 85 and older than 29 years old.  Having a low socioeconomic status.  Being African American. SYMPTOMS Signs and symptoms of preterm labor include:  Menstrual-like cramps, abdominal pain, or back pain. Uterine contractions that are regular, as frequent as six in an hour, regardless of their intensity (may be mild or  painful). Contractions that start on the top of the uterus and spread down to the lower abdomen and back.  A sense of increased pelvic pressure.  A watery or bloody mucus discharge that comes from the vagina.  TREATMENT Depending on the length of the pregnancy and other circumstances, your health care provider may suggest bed rest. If necessary, there are medicines that can be given to stop contractions and to mature the fetal lungs. If labor happens before 34 weeks of pregnancy, a prolonged hospital stay may be recommended. Treatment depends on the condition of both you and the fetus.  WHAT SHOULD YOU DO IF YOU THINK YOU ARE IN PRETERM LABOR? Call your health care provider right away. You will need to go to the hospital to get checked immediately. HOW CAN YOU PREVENT PRETERM LABOR IN FUTURE PREGNANCIES? You should:  Stop smoking if you smoke. Maintain healthy weight gain and avoid chemicals and drugs that are not necessary. Be watchful for any type of infection. Inform your health care provider if you have a known history of preterm labor. Document Released: 01/30/2004 Document Revised: 07/12/2013 Document Reviewed: 12/12/2012 [facility] Patient Information 2015 [place], [place]. This information is not intended to replace advice given to you by your health care provider. Make sure you discuss any  questions you have with your health care provider.   Understand these instructions.  Will watch your condition.  Will get help right away if you are not doing well or get worse. Document Released: 01/30/2003 Document Revised: 11/14/2013 Document Reviewed: 07/17/2013 [facility] Patient Information 2015 [place], [place]. This information is not intended to replace advice given to you by your health care provider. Make sure you discuss any questions you have with your health care provider.

## 2015-04-02 NOTE — Discharge Summary (Signed)
Patient ID: Dawn Dickson MRN: [id number] DOB/AGE: 12/27/1985 29 y.o.  Admit date: 04/01/2015 Discharge date: 04/02/15  Admission Diagnoses: 26 WKS,CTX, preterm labor  Discharge Diagnoses: 79 WKS,CTX, arrested preterm labor         Discharged Condition: stable  Hospital Course: Pt admitted with regular contractions, vaginal bleeding and having made cervical change from 3 cm in the office to 5 cm upon presentation. Initially pt contracting on monitor every 5 minutes but did not appear particularly uncomfortable. D/w pt R/B of later preterm BMZ and decision to proceed. No tocolysis given. Due to bleeding DIC panel sent and returned normal. U/s done with no evidence abruption of previa. Pt remained comfortable through the afternoon with mild contractions, decreasing in frequency. No further bleeding though continued to have old brown/ black stained d/c. In the evening pt was allowed to eat. Cervix regressed to 4 cm by evening.  On HD #2 pt notes continued mild contraction, no LOF, no active VB, good FM. Slept well.   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 No further cervical change and in fact cervical regression with IV hydration and bed rest. Reactive fetal testing. Will  Give 2nd BMZ dose then d/c home on modified bedrest, labor precautions reviewed, plan daily kick counts.   Consults: None and MFM  Treatments: IV hydration and bed rest  Disposition: home      Medication List    STOP taking these medications        FISH OIL PO     nitrofurantoin 100 MG capsule  Commonly known as:  MACRODANTIN      TAKE these medications        CITRANATAL 90 DHA 90-1 & 300 MG Misc  Take 1 tablet by mouth daily.     folic acid 416 MCG tablet  Commonly known as:  FOLVITE  Take 400 mcg by mouth daily.         Signed: Ala Dach., MD MD 04/02/2015, 9:04 AM

## 2015-04-05 LAB — TYPE AND SCREEN
ABO/RH(D): B NEG
Antibody Screen: POSITIVE
DAT, IgG: NEGATIVE
Unit division: 0
Unit division: 0

## 2015-04-16 ENCOUNTER — Inpatient Hospital Stay (HOSPITAL_COMMUNITY): Payer: 59 | Admitting: Anesthesiology

## 2015-04-16 ENCOUNTER — Encounter (HOSPITAL_COMMUNITY): Payer: Self-pay | Admitting: *Deleted

## 2015-04-16 ENCOUNTER — Inpatient Hospital Stay (HOSPITAL_COMMUNITY)
Admission: AD | Admit: 2015-04-16 | Discharge: 2015-04-18 | DRG: 775 | Disposition: A | Discharge status: Home / Self Care | Payer: 59 | Source: Ambulatory Visit | Attending: Obstetrics & Gynecology | Admitting: Obstetrics & Gynecology

## 2015-04-16 DIAGNOSIS — Z3A37 37 weeks gestation of pregnancy: Secondary | ICD-10-CM | POA: Diagnosis present

## 2015-04-16 LAB — RPR: RPR Ser Ql: NONREACTIVE

## 2015-04-16 LAB — CBC
HCT: 40 % (ref 36.0–46.0)
HEMOGLOBIN: 13.8 g/dL (ref 12.0–15.0)
MCH: 28.9 pg (ref 26.0–34.0)
MCHC: 34.5 g/dL (ref 30.0–36.0)
MCV: 83.9 fL (ref 78.0–100.0)
Platelets: 193 10*3/uL (ref 150–400)
RBC: 4.77 MIL/uL (ref 3.87–5.11)
RDW: 14.5 % (ref 11.5–15.5)
WBC: 7.1 10*3/uL (ref 4.0–10.5)

## 2015-04-16 MED ORDER — LACTATED RINGERS IV SOLN
500.0000 mL | INTRAVENOUS | Status: DC | PRN
  Administered 2015-04-16: 1000 mL via INTRAVENOUS

## 2015-04-16 MED ORDER — EPHEDRINE 5 MG/ML INJ: 10.0000 mg | INTRAVENOUS | Status: DC | PRN

## 2015-04-16 MED ORDER — PRENATAL MULTIVITAMIN CH
1.0000 | ORAL_TABLET | Freq: Every day | ORAL | Status: DC
  Administered 2015-04-17 – 2015-04-18 (×2): 1 via ORAL
  Filled 2015-04-16 (×2): qty 1

## 2015-04-16 MED ORDER — BENZOCAINE-MENTHOL 20-0.5 % EX AERO
1.0000 "application " | INHALATION_SPRAY | CUTANEOUS | Status: DC | PRN
  Filled 2015-04-16: qty 56

## 2015-04-16 MED ORDER — ACETAMINOPHEN 325 MG PO TABS: 650.0000 mg | ORAL_TABLET | ORAL | Status: DC | PRN

## 2015-04-16 MED ORDER — ZOLPIDEM TARTRATE 5 MG PO TABS: 5.0000 mg | ORAL_TABLET | Freq: Every evening | ORAL | Status: DC | PRN

## 2015-04-16 MED ORDER — PHENYLEPHRINE 40 MCG/ML (10ML) SYRINGE FOR IV PUSH (FOR BLOOD PRESSURE SUPPORT)
80.0000 ug | PREFILLED_SYRINGE | INTRAVENOUS | Status: DC | PRN
  Filled 2015-04-16: qty 20

## 2015-04-16 MED ORDER — TETANUS-DIPHTH-ACELL PERTUSSIS 5-2.5-18.5 LF-MCG/0.5 IM SUSP: 0.5000 mL | Freq: Once | INTRAMUSCULAR | Status: DC

## 2015-04-16 MED ORDER — FENTANYL 2.5 MCG/ML BUPIVACAINE 1/10 % EPIDURAL INFUSION (WH - ANES)
14.0000 mL/h | INTRAMUSCULAR | Status: DC | PRN
  Administered 2015-04-16 (×2): 14 mL/h via EPIDURAL
  Filled 2015-04-16: qty 125

## 2015-04-16 MED ORDER — BUPIVACAINE HCL (PF) 0.25 % IJ SOLN
INTRAMUSCULAR | Status: DC | PRN
  Administered 2015-04-16 (×2): 4 mL

## 2015-04-16 MED ORDER — ONDANSETRON HCL 4 MG/2ML IJ SOLN: 4.0000 mg | INTRAMUSCULAR | Status: DC | PRN

## 2015-04-16 MED ORDER — SENNOSIDES-DOCUSATE SODIUM 8.6-50 MG PO TABS
2.0000 | ORAL_TABLET | ORAL | Status: DC
  Administered 2015-04-16 – 2015-04-18 (×2): 2 via ORAL
  Filled 2015-04-16 (×2): qty 2

## 2015-04-16 MED ORDER — OXYTOCIN 40 UNITS IN LACTATED RINGERS INFUSION - SIMPLE MED
62.5000 mL/h | INTRAVENOUS | Status: DC
  Filled 2015-04-16 (×2): qty 1000

## 2015-04-16 MED ORDER — OXYTOCIN BOLUS FROM INFUSION: 500.0000 mL | INTRAVENOUS | Status: DC

## 2015-04-16 MED ORDER — OXYCODONE-ACETAMINOPHEN 5-325 MG PO TABS: 1.0000 | ORAL_TABLET | ORAL | Status: DC | PRN

## 2015-04-16 MED ORDER — SIMETHICONE 80 MG PO CHEW: 80.0000 mg | CHEWABLE_TABLET | ORAL | Status: DC | PRN

## 2015-04-16 MED ORDER — DIPHENHYDRAMINE HCL 25 MG PO CAPS: 25.0000 mg | ORAL_CAPSULE | Freq: Four times a day (QID) | ORAL | Status: DC | PRN

## 2015-04-16 MED ORDER — SODIUM BICARBONATE 8.4 % IV SOLN
INTRAVENOUS | Status: DC | PRN
  Administered 2015-04-16 (×2): 5 mL via EPIDURAL

## 2015-04-16 MED ORDER — WITCH HAZEL-GLYCERIN EX PADS: 1.0000 "application " | MEDICATED_PAD | CUTANEOUS | Status: DC | PRN

## 2015-04-16 MED ORDER — LIDOCAINE-EPINEPHRINE (PF) 2 %-1:200000 IJ SOLN
INTRAMUSCULAR | Status: DC | PRN
  Administered 2015-04-16: 3 mL

## 2015-04-16 MED ORDER — ONDANSETRON HCL 4 MG PO TABS
4.0000 mg | ORAL_TABLET | ORAL | Status: DC | PRN
Start: 2015-04-16 — End: 2015-04-18

## 2015-04-16 MED ORDER — DIBUCAINE 1 % RE OINT: 1.0000 "application " | TOPICAL_OINTMENT | RECTAL | Status: DC | PRN

## 2015-04-16 MED ORDER — DIPHENHYDRAMINE HCL 50 MG/ML IJ SOLN: 12.5000 mg | INTRAMUSCULAR | Status: DC | PRN

## 2015-04-16 MED ORDER — LACTATED RINGERS IV SOLN
INTRAVENOUS | Status: DC
  Administered 2015-04-16: 04:00:00 via INTRAVENOUS

## 2015-04-16 MED ORDER — BUTORPHANOL TARTRATE 1 MG/ML IJ SOLN: 1.0000 mg | INTRAMUSCULAR | Status: DC | PRN

## 2015-04-16 MED ORDER — OXYCODONE-ACETAMINOPHEN 5-325 MG PO TABS: 2.0000 | ORAL_TABLET | ORAL | Status: DC | PRN

## 2015-04-16 MED ORDER — CITRIC ACID-SODIUM CITRATE 334-500 MG/5ML PO SOLN: 30.0000 mL | ORAL | Status: DC | PRN

## 2015-04-16 MED ORDER — IBUPROFEN 600 MG PO TABS
600.0000 mg | ORAL_TABLET | Freq: Four times a day (QID) | ORAL | Status: DC
  Administered 2015-04-16 – 2015-04-18 (×8): 600 mg via ORAL
  Filled 2015-04-16 (×8): qty 1

## 2015-04-16 MED ORDER — FLEET ENEMA 7-19 GM/118ML RE ENEM: 1.0000 | ENEMA | RECTAL | Status: DC | PRN

## 2015-04-16 MED ORDER — ONDANSETRON HCL 4 MG/2ML IJ SOLN
4.0000 mg | Freq: Four times a day (QID) | INTRAMUSCULAR | Status: DC | PRN
  Administered 2015-04-16: 4 mg via INTRAVENOUS
  Filled 2015-04-16: qty 2

## 2015-04-16 MED ORDER — LANOLIN HYDROUS EX OINT: TOPICAL_OINTMENT | CUTANEOUS | Status: DC | PRN

## 2015-04-16 MED ORDER — LIDOCAINE HCL (PF) 1 % IJ SOLN
30.0000 mL | INTRAMUSCULAR | Status: DC | PRN
  Filled 2015-04-16 (×2): qty 30

## 2015-04-16 NOTE — Anesthesia Procedure Notes (Signed)
Epidural Patient location during procedure: OB  Staffing Anesthesiologist: Rylend Pietrzak, CHRIS Performed by: anesthesiologist   Preanesthetic Checklist Completed: patient identified, surgical consent, pre-op evaluation, timeout performed, IV checked, risks and benefits discussed and monitors and equipment checked  Epidural Patient position: sitting Prep: site prepped and draped and DuraPrep Patient monitoring: heart rate, cardiac monitor, continuous pulse ox and blood pressure Approach: midline Location: L3-L4 Injection technique: LOR saline  Needle:  Needle type: Tuohy  Needle gauge: 17 G Needle length: 9 cm Needle insertion depth: 6 cm Catheter type: closed end flexible Catheter size: 19 Gauge Catheter at skin depth: 13 cm Test dose: negative and 2% lidocaine with Epi 1:200 K  Assessment Events: blood not aspirated, injection not painful, no injection resistance, negative IV test and no paresthesia  Additional Notes H+P and labs checked, risks and benefits discussed with the patient, consent obtained, procedure tolerated well and without complications.  Reason for block:procedure for pain

## 2015-04-16 NOTE — Plan of Care (Signed)
Problem: Phase I Progression Outcomes Goal: FHR checked 5 minutes after meds (ROM) Rupture of Membranes Outcome: Not Applicable Date Met:  71/85/50 Patient presented to MAU having ruptured at home

## 2015-04-16 NOTE — H&P (Signed)
Dawn Dickson is a 29 y.o. female G1 at 37.5 wks presents with LOF (clear per pt) since 2 am and strong painful UCs since 3 am. Last cervix check in office 2 days back was 4-5 cm/90% and she was 6/100%/0 station in MAU. She is s/p epidural now and has progressed quickly to 9 cm, after several bolus doses of epidural, she now has pain relief.  [facility]- Dr Benjie Karvonen - FOB w/ spina bifida, nl AFP, on early high dose folic acid - b/l fetal renal pyelectasis stable, last sono 8.5 mm each with full bladder - Rh neg, s/p Rhogam x 2 with weak antiD but repeat was unchanged - recurrent UTI in preg - H/o LEEP with cervical funneling noted at 34 wks, preterm dilatation of cervix at 35 wks and s/p steroids x 2 doses at 35 wks  - Placenta previa resolved at 23 wks but 3 episodes of bleeding after normal location - cervical ectropion -Palpitations s/p cardiac evaluation  - Intense itching hands, feet and then generalized for few days, with normal Bile acids on 5/20 (repeat was sent on 5/23- pending)  History OB History    Gravida Para Term Preterm AB TAB SAB Ectopic Multiple Living   1              Past Medical History  Diagnosis Date  . Vaginal bleeding during pregnancy, antepartum 11/10/2014   Past Surgical History  Procedure Laterality Date  . Leep  2009  . Wisdom tooth extraction     Family History: family history is not on file. Social History:  reports that she has never smoked. She has never used smokeless tobacco. She reports that she does not drink alcohol or use illicit drugs.   Prenatal Transfer Tool  Maternal Diabetes: No Genetic Screening: Normal Ultrascreen and AFP1  Maternal Ultrasounds/Referrals: Bilateral fetal renal pylectasis but stable, last sono 5/23 noted both at 8.3-8.7 mm Fetal Ultrasounds or other Referrals:  None Maternal Substance Abuse:  No Significant Maternal Medications:  Zofran as needed. Rhogam 2 doses (1st trim bleeding and at 26.5 wks for bleeding) Significant  Maternal Lab Results:  Lab values include: Group B Strep negative, Rh negative with weak positive antiD titer too low to calculate and repeat in 1 mo was unchanged Other Comments:  FOB- spina bifida occulta, patient took high dose folic acid in 1st trim.     ROS   Negative   Dilation: 9 Effacement (%): 100 Station: 0 Exam by:: BRoque Cash RN  Blood pressure 129/105, pulse 101, temperature 99.9 F (37.7 C), temperature source Oral, resp. rate 20, height 5\' 10"  (1.778 m), weight 165 lb (74.844 kg), SpO2 99 %. Exam Physical Exam :  A&O x 3, no acute distress. Pleasant HEENT neg, no thyromegaly Lungs CTA bilat CV RRR, S1S2 normal Abdo soft, non tender, non acute Extr no edema/ tenderness Pelvic as above FHT  150s/ + accels/ no decels/ moderate variability- category I Toco spontaneous q 3 minutes  Prenatal labs: ABO, Rh: --/--/B NEG (05/24 0348) Antibody: PENDING (05/24 0348) Rubella: Immune (10/21 0000) RPR: Non Reactive (05/09 1439)  HBsAg: Negative (10/21 0000)  HIV: Non-reactive (10/21 0000)  GBS: Negative (05/09 0000)   Assessment/Plan: 29 yo, G1 at 37.5 wks with SROM and active labor. Admit, epidural, GBS(-). Anticipate 7.1/2 lbs per last sono, anticipate SVD.  Inform Peds at birth re renal pylectasis   Demetria Iwai R 04/16/2015, 6:06 AM

## 2015-04-16 NOTE — Anesthesia Preprocedure Evaluation (Signed)
Anesthesia Evaluation  Patient identified by MRN, date of birth, ID band Patient awake    Reviewed: Allergy & Precautions, NPO status , Patient's Chart, lab work & pertinent test results  History of Anesthesia Complications Negative for: history of anesthetic complications  Airway Mallampati: II  TM Distance: >3 FB Neck ROM: Full    Dental  (+) Teeth Intact   Pulmonary neg pulmonary ROS,  breath sounds clear to auscultation        Cardiovascular negative cardio ROS  Rhythm:Regular     Neuro/Psych negative neurological ROS  negative psych ROS   GI/Hepatic negative GI ROS, Neg liver ROS,   Endo/Other  negative endocrine ROS  Renal/GU negative Renal ROS     Musculoskeletal   Abdominal   Peds  Hematology   Anesthesia Other Findings   Reproductive/Obstetrics (+) Pregnancy                             Anesthesia Physical Anesthesia Plan  ASA: II  Anesthesia Plan: Epidural   Post-op Pain Management:    Induction:   Airway Management Planned:   Additional Equipment:   Intra-op Plan:   Post-operative Plan:   Informed Consent: I have reviewed the patients History and Physical, chart, labs and discussed the procedure including the risks, benefits and alternatives for the proposed anesthesia with the patient or authorized representative who has indicated his/her understanding and acceptance.     Plan Discussed with: Anesthesiologist  Anesthesia Plan Comments:         Anesthesia Quick Evaluation

## 2015-04-16 NOTE — Anesthesia Postprocedure Evaluation (Signed)
  Anesthesia Post-op Note  Patient: Dawn Dickson  Procedure(s) Performed: * No procedures listed *  Patient Location: Mother/Baby  Anesthesia Type:Epidural  Level of Consciousness: awake  Airway and Oxygen Therapy: Patient Spontanous Breathing  Post-op Pain: mild  Post-op Assessment: Patient's Cardiovascular Status Stable and Respiratory Function Stable  Post-op Vital Signs: stable  Last Vitals:  Filed Vitals:   04/16/15 1430  BP: 105/70  Pulse: 85  Temp: 36.6 C  Resp: 18    Complications: No apparent anesthesia complications

## 2015-04-16 NOTE — Lactation Note (Signed)
This note was copied from the chart of [place]. Lactation Consultation Note  Patient Name: Dawn Dickson [id number] Date: 04/16/2015 Reason for consult: Follow-up assessment Baby sleepy today and not wanting to BF since right after delivery. Spoon fed approx 1 ml of colostrum to stimulate baby to BF at this visit. Assisted Mom with latch, baby is tongue thrusting and having some difficulty sustaining the latch, will take few suckles then pop off the breast. Mom has short nipple shafts bilateral and small amount of aerola edema. Hand pump given to Mom to pre-pump to help with latch. Demonstrated use/cleaning of hand pump.  Advised Mom if baby does not latch to hand express and have RN assist with spoon feeding some colostrum till baby is more interested in BF. Baby has spit up few times per Mom. Encouraged to BF with feeding ques. Ask for assist with latch as needed.   Maternal Data Has patient been taught Hand Expression?: Yes Does the patient have breastfeeding experience prior to this delivery?: No  Feeding Feeding Type: Breast Fed Length of feed: 10 min (off/on)  LATCH Score/Interventions Latch: Repeated attempts needed to sustain latch, nipple held in mouth throughout feeding, stimulation needed to elicit sucking reflex. Intervention(s): Adjust position;Assist with latch;Breast massage;Breast compression  Audible Swallowing: None  Type of Nipple: Everted at rest and after stimulation (short nipple shafts bilateral)  Comfort (Breast/Nipple): Soft / non-tender     Hold (Positioning): Assistance needed to correctly position infant at breast and maintain latch. Intervention(s): Breastfeeding basics reviewed;Support Pillows;Position options;Skin to skin  LATCH Score: 6  Lactation Tools Discussed/Used Tools: Pump Breast pump type: Manual   Consult Status Consult Status: Follow-up Date: 04/17/15 Follow-up type: In-patient    Katrine Coho 04/16/2015, 8:48  PM

## 2015-04-17 LAB — CBC
HCT: 39.1 % (ref 36.0–46.0)
Hemoglobin: 13.2 g/dL (ref 12.0–15.0)
MCH: 28.4 pg (ref 26.0–34.0)
MCHC: 33.8 g/dL (ref 30.0–36.0)
MCV: 84.1 fL (ref 78.0–100.0)
PLATELETS: 190 10*3/uL (ref 150–400)
RBC: 4.65 MIL/uL (ref 3.87–5.11)
RDW: 14.7 % (ref 11.5–15.5)
WBC: 9.5 10*3/uL (ref 4.0–10.5)

## 2015-04-17 NOTE — Lactation Note (Signed)
This note was copied from the chart of [place]. Lactation Consultation Note Follow up visit at 26 hours of age.  Mom requesting visit for questions regarding nipple pain.  RN gave comfort gels.  Mom has short shaft nipples that evert/elongate with hand expression/stimulation.  Right nipple is bruised.  Mom is able to hand express colostrum to rub into nipples.  Encouraged prior to feeding and after.  Encouraged mom to continue to wear comfort gels.  Discussed positioning and support pillows.  All questions answered.  Baby is asleep on FOB STS.  Mom to call for assist as needed.   Patient Name: Dawn Dickson [id number] Date: 04/17/2015 Reason for consult: Follow-up assessment   Maternal Data    Feeding Feeding Type: Breast Fed Length of feed: 25 min  LATCH Score/Interventions                      Lactation Tools Discussed/Used Tools: Comfort gels   Consult Status Consult Status: Follow-up Date: 04/18/15 Follow-up type: In-patient    Justice Britain 04/17/2015, 11:43 PM

## 2015-04-17 NOTE — Progress Notes (Signed)
Patient ID: Dawn Dickson, female   DOB: July 19, 1986, 29 y.o.   MRN: [id number] PPD # 1 SVD  S:  Reports feeling well             Tolerating po/ No nausea or vomiting             Bleeding is light             Pain controlled with naproxen (OTC)             Up ad lib / ambulatory / voiding without difficulties    Newborn  Information for the patient's newborn:  Dawn Dickson [[id number]]  female  breast feeding  / Circumcision planning   O:  A & O x 3, in no apparent distress              VS:  Filed Vitals:   04/16/15 1221 04/16/15 1430 04/16/15 1839 04/17/15 0647  BP: 114/79 105/70 112/64 99/60  Pulse: 72 85 79 82  Temp: 97.8 F (36.6 C) 97.8 F (36.6 C) 98.4 F (36.9 C) 98.2 F (36.8 C)  TempSrc: Axillary Axillary Axillary Oral  Resp: 18 18 18 18   Height:      Weight:      SpO2:        LABS:  Recent Labs  04/16/15 0348 04/17/15 0604  WBC 7.1 9.5  HGB 13.8 13.2  HCT 40.0 39.1  PLT 193 190    Blood type: B NEG (05/24 0348) / Infant Rh NEG - no Rhophylac needed  Rubella: Immune (10/21 0000)   I&O: I/O last 3 completed shifts: In: -  Out: 3419 [Urine:1400; Blood:168]             Lungs: Clear and unlabored  Heart: regular rate and rhythm / no murmurs  Abdomen: soft, non-tender, non-distended              Fundus: firm, non-tender, U-1  Perineum: labial repair healing well  Lochia: minimal  Extremities: No edema, no calf pain or tenderness, No Homans    A/P: PPD # 1  29 y.o., G1P1001   Principal Problem:    Postpartum care following vaginal delivery (5/24) with labial laceration    Doing well - stable status  Routine post partum orders  Anticipate discharge tomorrow    Laury Deep, M, MSN, CNM 04/17/2015, 11:29 AM

## 2015-04-18 MED ORDER — IBUPROFEN 600 MG PO TABS: 600.0000 mg | ORAL_TABLET | Freq: Four times a day (QID) | ORAL | Status: DC

## 2015-04-18 NOTE — Lactation Note (Signed)
This note was copied from the chart of [place]. Lactation Consultation Note  Patient Name: Dawn Dickson [id number] Date: 04/18/2015 Reason for consult: Follow-up assessment  Baby is 44 hrs old and is at 8 % weight loss, per mom baby cluster fed all night.  Transitional stool at [facility] consult.  Baby fed both sides , initially discomfort with latch , easing chin down, eased discomfort ,.  Only a few swallows noted, even with breast compressions. Able to hand express drops after  Baby released from the  breast.  As you can see LC has seen mom for several latches today , baby started out sluggish and mom exhausted. After 1st visit felt mom needed to take a nap while the baby was so sluggish and sleepy. Mom slept for 1 1/2 hrs.  And per mom felt refreshed somewhat, but still tired.  [place] felt after 2nd larch in the after noon , baby was more aggressive , few more swallows noted, but still somewhat sluggish. So LC had dad hold baby ,while LC showed mom how to pump with the DEBP Symphony) for 10 mins  2 ml Yield.  LC latched the baby with a #24 NS on the left breast and baby fed with increased swallows and vigor. While mom was feeding  Had her pump on the other breast, and increased swallows noted. In between latches, spoon fed 2 ml of EBM , and then relatched  On the right breast with #20 NS and had mom pump on the opposite breast , increased swallows noted and baby more vigorous  , increased with breast compressions. Milk noted in the nipple shield when baby came off. LC feels due to mom being so exhausted and sore her she isn't letting her milk down as well. Reassured parents the multiply tasking LC pl;an will improve once Suezanne Jacquet is more consistent with latching and sore ness improves. Sore nipple and engorgement prevention and tx reviewed , referring to the Baby and me booklet.  Nipple shield application did help moms comfort, along with using the comfort gels and pre-pumping with hand  pump. Mom and dad taught how to [place] the curved tip syringe to instill EBM into the nipple shield for extra calories. LC stressed the importance of consistent extra pumping after feedings when the baby isn't cluster feeding.  Lactation Written Plan for mom and D/C  Mom - rest , naps , plenty fluids, especially water , nutritious snacks and meal. Feedings with feeding cues and about every 2-3 hours. With weight loss baby's will cluster feed and with growth spurts,  Jaundice can make a baby sluggish ( skin to skin feedings until the baby can stay awake for feedings )  Steps for Latching - breast massage, hand express, pre- pump to prime the milk ducts with hand pump, apply #20 NS on the right , #24 NS on the left  Instill EBM into the NS  Latch with firm support and breast compressions until swallows, and then intermittent. Average feeding 15 - 20 mins 1st breast - if Suezanne Jacquet is in a good swallowing pattern let him finish, Watch for non - nutritive feeding patterns and hanging out , may have to release suction , burp and wake up .  Extra pumping after 5 - 6 feedings a day for 10 -15 mins when Weir isn't cluster feeding to enhance milk coming in , weight gain, and decreasing jaundice.   Parents receptive to a [facility] O/P apt on Tuesday May 31th at  4pm , apt reminder sheet given to mom and dad, copy of written LC plan , and extra diary sheets.    Maternal Data Has patient been taught Hand Expression?: Yes  Feeding Feeding Type: Breast Fed Length of feed: 8 min (few swallows )  LATCH Score/Interventions Latch: Grasps breast easily, tongue down, lips flanged, rhythmical sucking. Intervention(s): Adjust position;Assist with latch;Breast massage;Breast compression  Audible Swallowing: A few with stimulation  Type of Nipple: Everted at rest and after stimulation  Comfort (Breast/Nipple): Filling, red/small blisters or bruises, mild/mod discomfort  Problem noted: Filling;Cracked, bleeding, blisters,  bruises;Mild/Moderate discomfort Interventions (Filling): Massage;Reverse pressure;Firm support;Hand pump  Hold (Positioning): Assistance needed to correctly position infant at breast and maintain latch. Intervention(s): Breastfeeding basics reviewed;Support Pillows;Position options;Skin to skin  LATCH Score: 7  Lactation Tools Discussed/Used Tools: Comfort gels;Pump Breast pump type: Manual WIC Program: No   Consult Status Consult Status: Follow-up Date: 04/18/15 Follow-up type: In-patient    Myer Haff 04/18/2015, 11:04 AM

## 2015-04-18 NOTE — Progress Notes (Signed)
PPD 2 SVD  S:  Reports feeling well - just nipple pain from latch             Tolerating po/ No nausea or vomiting             Bleeding is spotting             Pain controlled with motrin only             Up ad lib / ambulatory / voiding QS  Newborn breast feeding   O:               VS: BP 100/59 mmHg  Pulse 74  Temp(Src) 97.4 F (36.3 C) (Oral)  Resp 18  Ht 5\' 10"  (1.778 m)  Wt 74.844 kg (165 lb)  BMI 23.68 kg/m2  SpO2 99%  Breastfeeding? Unknown   LABS:              Recent Labs  04/16/15 0348 04/17/15 0604  WBC 7.1 9.5  HGB 13.8 13.2  PLT 193 190               Blood type: --/--/B NEG (05/24 0348)  Rubella: Immune (10/21 0000)                     Physical Exam:             Alert and oriented X3  Abdomen: soft, non-tender, non-distended              Fundus: firm, non-tender, U-1  Extremities: no edema, no calf pain or tenderness    A: PPD # 2   Doing well - stable status  P: Routine post partum orders  DC home  Artelia Laroche CNM, MSN, [facility] 04/18/2015, 8:24 AM

## 2015-04-18 NOTE — Discharge Summary (Signed)
Obstetric Discharge Summary Reason for Admission: onset of labor Prenatal Procedures: ultrasound Intrapartum Procedures: spontaneous vaginal delivery Postpartum Procedures: none Complications-Operative and Postpartum: none HEMOGLOBIN  Date Value Ref Range Status  04/17/2015 13.2 12.0 - 15.0 g/dL Final   HCT  Date Value Ref Range Status  04/17/2015 39.1 36.0 - 46.0 % Final    Physical Exam:  General: alert, cooperative and no distress Lochia: appropriate Uterine Fundus: firm Incision: healing well DVT Evaluation: No evidence of DVT seen on physical exam.  Discharge Diagnoses: Term Pregnancy-delivered  Discharge Information: Date: 04/18/2015 Activity: pelvic rest Diet: routine Medications: None and Ibuprofen Condition: stable Instructions: refer to practice specific booklet Discharge to: home Follow-up Information    Follow up with MODY,VAISHALI R, MD. Schedule an appointment as soon as possible for a visit in 6 weeks.   Specialty:  Obstetrics and Gynecology   Contact information:   [place] [address] [phone]       Newborn Data: Live born female  Birth Weight: 7 lb 10.8 oz (3481 g) APGAR: 9, 9  Home with mother.  Artelia Laroche 04/18/2015, 9:25 AM

## 2015-04-19 LAB — TYPE AND SCREEN
ABO/RH(D): B NEG
Antibody Screen: POSITIVE
DAT, IgG: NEGATIVE
Unit division: 0
Unit division: 0

## 2015-04-23 ENCOUNTER — Ambulatory Visit (HOSPITAL_COMMUNITY)
Admit: 2015-04-23 | Discharge: 2015-04-23 | Disposition: A | Discharge status: Home / Self Care | Payer: 59 | Attending: Obstetrics & Gynecology | Admitting: Obstetrics & Gynecology

## 2015-04-23 NOTE — Lactation Note (Signed)
Lactation Consult  Mother's reason for visit:  Follow-up for NS and hyperbilirubinemia Visit Type:  OP Appointment Notes:  Parents have been following the lactation plan well. Dawn Dickson has increased his weight by about 6 oz in 4 days.  He was able to latch to the left breast but continued to come off. We placed him on the right breast and used sandwiching techinique which  Helped him to stay attached.  He transferred a total of 30 ml. He was repositioned on the left breast and stayed attached.  He had rhythmic patterns with swallows observed.He transferred 36 ml for a total of 66 ml. He came off of the breast sound asleep.  Mom then was able to post pump approx 60 ml additional.  Plan for now is to feed without the nipple shield.  Post pump for 10 minutes 3 times in 24 hours.    Follow-up  Friday Smart Start Tuesday BF support group Friday 6/10 lactation appointment. Consult:  Initial Lactation Consultant:  Van Clines  ________________________________________________________________________ Sharene Skeans Name: Dawn Dickson Date of Birth: 04/16/2015 Pediatrician: Redmond Baseman Gender: female Gestational Age: [redacted]w[redacted]d (At Birth) Birth Weight: 7 lb 10.8 oz (3481 g) Weight at Discharge: Weight: 7 lb 1.1 oz (3205 g)Date of Discharge: 04/18/2015 [facility] Weights   04/16/15 0941 04/16/15 2352 04/17/15 2349  Weight: 7 lb 10.8 oz (3481 g) 7 lb 7.9 oz (3400 g) 7 lb 1.1 oz (3205 g)   Last weight taken from location outside of [facility]: 6+13 on Saturday Location:Pediatrician's office Weight today: 7+3   ________________________________________________________________________  Mother's Name: Dawn Dickson  Voids:  12 in 24 hrs.  Color:  Clear yellow Stools:  8 in 24 hrs.  Color:  Yellow  ________________________________________________________________________

## 2015-04-25 ENCOUNTER — Inpatient Hospital Stay (HOSPITAL_COMMUNITY): Admission: RE | Admit: 2015-04-25 | Payer: 59 | Source: Ambulatory Visit

## 2015-05-03 ENCOUNTER — Ambulatory Visit (HOSPITAL_COMMUNITY): Payer: 59

## 2015-11-24 NOTE — L&D Delivery Note (Signed)
Delivery Note Delivery Note At 1:51 PM a viable and healthy female was delivered via Vaginal, Spontaneous Delivery (Presentation: OA ).  APGAR: 8, 9; weight -pending   .   Placenta status: spontaneous, complete.  Cord:  with the following complications- tight nuchal, reduced over the head.   Anesthesia:  Pudendal block and Nitrous oxide inhalation  Episiotomy: None Lacerations: None Est. Blood Loss (mL): 200  Mom to postpartum.  Baby to Couplet care / Skin to Skin.  [place]ington Devine R 06/30/2016, 2:40 PM

## 2015-12-23 LAB — OB RESULTS CONSOLE HIV ANTIBODY (ROUTINE TESTING): HIV: NONREACTIVE

## 2016-06-30 ENCOUNTER — Encounter (HOSPITAL_COMMUNITY): Payer: Self-pay | Admitting: *Deleted

## 2016-06-30 ENCOUNTER — Inpatient Hospital Stay (HOSPITAL_COMMUNITY)
Admission: AD | Admit: 2016-06-30 | Discharge: 2016-07-02 | DRG: 775 | Disposition: A | Discharge status: Home / Self Care | Payer: 59 | Source: Ambulatory Visit | Attending: Obstetrics & Gynecology | Admitting: Obstetrics & Gynecology

## 2016-06-30 DIAGNOSIS — IMO0001 Reserved for inherently not codable concepts without codable children: Secondary | ICD-10-CM

## 2016-06-30 DIAGNOSIS — Z6791 Unspecified blood type, Rh negative: Secondary | ICD-10-CM

## 2016-06-30 DIAGNOSIS — Z3483 Encounter for supervision of other normal pregnancy, third trimester: Secondary | ICD-10-CM | POA: Diagnosis present

## 2016-06-30 DIAGNOSIS — Z3A37 37 weeks gestation of pregnancy: Secondary | ICD-10-CM

## 2016-06-30 DIAGNOSIS — O26893 Other specified pregnancy related conditions, third trimester: Secondary | ICD-10-CM | POA: Diagnosis present

## 2016-06-30 LAB — CBC
HCT: 38.6 % (ref 36.0–46.0)
Hemoglobin: 13.3 g/dL (ref 12.0–15.0)
MCH: 27.9 pg (ref 26.0–34.0)
MCHC: 34.5 g/dL (ref 30.0–36.0)
MCV: 80.9 fL (ref 78.0–100.0)
Platelets: 251 10*3/uL (ref 150–400)
RBC: 4.77 MIL/uL (ref 3.87–5.11)
RDW: 15.1 % (ref 11.5–15.5)
WBC: 11.3 10*3/uL — AB (ref 4.0–10.5)

## 2016-06-30 MED ORDER — IBUPROFEN 600 MG PO TABS
600.0000 mg | ORAL_TABLET | Freq: Four times a day (QID) | ORAL | Status: DC
  Administered 2016-06-30 – 2016-07-02 (×7): 600 mg via ORAL
  Filled 2016-06-30 (×7): qty 1

## 2016-06-30 MED ORDER — SOD CITRATE-CITRIC ACID 500-334 MG/5ML PO SOLN: 30.0000 mL | ORAL | Status: DC | PRN

## 2016-06-30 MED ORDER — PRENATAL MULTIVITAMIN CH
1.0000 | ORAL_TABLET | Freq: Every day | ORAL | Status: DC
  Administered 2016-07-01: 1 via ORAL
  Filled 2016-06-30: qty 1

## 2016-06-30 MED ORDER — ZOLPIDEM TARTRATE 5 MG PO TABS: 5.0000 mg | ORAL_TABLET | Freq: Every evening | ORAL | Status: DC | PRN

## 2016-06-30 MED ORDER — EPHEDRINE 5 MG/ML INJ
10.0000 mg | INTRAVENOUS | Status: DC | PRN
  Filled 2016-06-30: qty 4

## 2016-06-30 MED ORDER — OXYTOCIN BOLUS FROM INFUSION
500.0000 mL | Freq: Once | INTRAVENOUS | Status: AC
  Administered 2016-06-30: 500 mL via INTRAVENOUS

## 2016-06-30 MED ORDER — FENTANYL 2.5 MCG/ML BUPIVACAINE 1/10 % EPIDURAL INFUSION (WH - ANES): 14.0000 mL/h | INTRAMUSCULAR | Status: DC | PRN

## 2016-06-30 MED ORDER — OXYTOCIN 40 UNITS IN LACTATED RINGERS INFUSION - SIMPLE MED: 2.5000 [IU]/h | INTRAVENOUS | Status: DC

## 2016-06-30 MED ORDER — WITCH HAZEL-GLYCERIN EX PADS: 1.0000 "application " | MEDICATED_PAD | CUTANEOUS | Status: DC | PRN

## 2016-06-30 MED ORDER — OXYCODONE-ACETAMINOPHEN 5-325 MG PO TABS: 2.0000 | ORAL_TABLET | ORAL | Status: DC | PRN

## 2016-06-30 MED ORDER — LACTATED RINGERS IV SOLN
INTRAVENOUS | Status: DC
  Administered 2016-06-30: 13:00:00 via INTRAVENOUS

## 2016-06-30 MED ORDER — ONDANSETRON HCL 4 MG PO TABS: 4.0000 mg | ORAL_TABLET | ORAL | Status: DC | PRN

## 2016-06-30 MED ORDER — OXYTOCIN 40 UNITS IN LACTATED RINGERS INFUSION - SIMPLE MED
INTRAVENOUS | Status: AC
  Filled 2016-06-30: qty 1000

## 2016-06-30 MED ORDER — ONDANSETRON HCL 4 MG/2ML IJ SOLN: 4.0000 mg | INTRAMUSCULAR | Status: DC | PRN

## 2016-06-30 MED ORDER — OXYCODONE-ACETAMINOPHEN 5-325 MG PO TABS: 1.0000 | ORAL_TABLET | ORAL | Status: DC | PRN

## 2016-06-30 MED ORDER — LACTATED RINGERS IV SOLN: 500.0000 mL | INTRAVENOUS | Status: DC | PRN

## 2016-06-30 MED ORDER — DIPHENHYDRAMINE HCL 25 MG PO CAPS: 25.0000 mg | ORAL_CAPSULE | Freq: Four times a day (QID) | ORAL | Status: DC | PRN

## 2016-06-30 MED ORDER — ONDANSETRON HCL 4 MG/2ML IJ SOLN: 4.0000 mg | Freq: Four times a day (QID) | INTRAMUSCULAR | Status: DC | PRN

## 2016-06-30 MED ORDER — ACETAMINOPHEN 325 MG PO TABS: 650.0000 mg | ORAL_TABLET | ORAL | Status: DC | PRN

## 2016-06-30 MED ORDER — PHENYLEPHRINE 40 MCG/ML (10ML) SYRINGE FOR IV PUSH (FOR BLOOD PRESSURE SUPPORT)
80.0000 ug | PREFILLED_SYRINGE | INTRAVENOUS | Status: DC | PRN
  Filled 2016-06-30: qty 5

## 2016-06-30 MED ORDER — DIBUCAINE 1 % RE OINT: 1.0000 "application " | TOPICAL_OINTMENT | RECTAL | Status: DC | PRN

## 2016-06-30 MED ORDER — LACTATED RINGERS IV SOLN: 500.0000 mL | Freq: Once | INTRAVENOUS | Status: DC

## 2016-06-30 MED ORDER — SIMETHICONE 80 MG PO CHEW: 80.0000 mg | CHEWABLE_TABLET | ORAL | Status: DC | PRN

## 2016-06-30 MED ORDER — LIDOCAINE HCL (PF) 1 % IJ SOLN
30.0000 mL | INTRAMUSCULAR | Status: AC | PRN
  Administered 2016-06-30: 30 mL via SUBCUTANEOUS
  Filled 2016-06-30: qty 30

## 2016-06-30 MED ORDER — TETANUS-DIPHTH-ACELL PERTUSSIS 5-2.5-18.5 LF-MCG/0.5 IM SUSP: 0.5000 mL | Freq: Once | INTRAMUSCULAR | Status: DC

## 2016-06-30 MED ORDER — PHENYLEPHRINE 40 MCG/ML (10ML) SYRINGE FOR IV PUSH (FOR BLOOD PRESSURE SUPPORT)
PREFILLED_SYRINGE | INTRAVENOUS | Status: AC
  Filled 2016-06-30: qty 10

## 2016-06-30 MED ORDER — FENTANYL 2.5 MCG/ML BUPIVACAINE 1/10 % EPIDURAL INFUSION (WH - ANES)
INTRAMUSCULAR | Status: AC
  Filled 2016-06-30: qty 125

## 2016-06-30 MED ORDER — BENZOCAINE-MENTHOL 20-0.5 % EX AERO: 1.0000 "application " | INHALATION_SPRAY | CUTANEOUS | Status: DC | PRN

## 2016-06-30 MED ORDER — DIPHENHYDRAMINE HCL 50 MG/ML IJ SOLN: 12.5000 mg | INTRAMUSCULAR | Status: DC | PRN

## 2016-06-30 MED ORDER — SENNOSIDES-DOCUSATE SODIUM 8.6-50 MG PO TABS
2.0000 | ORAL_TABLET | ORAL | Status: DC
  Administered 2016-06-30 – 2016-07-01 (×2): 2 via ORAL
  Filled 2016-06-30 (×2): qty 2

## 2016-06-30 MED ORDER — LIDOCAINE HCL (PF) 1 % IJ SOLN
INTRAMUSCULAR | Status: AC
  Filled 2016-06-30: qty 30

## 2016-06-30 MED ORDER — FENTANYL CITRATE (PF) 100 MCG/2ML IJ SOLN: 100.0000 ug | Freq: Once | INTRAMUSCULAR | Status: DC

## 2016-06-30 MED ORDER — COCONUT OIL OIL
1.0000 "application " | TOPICAL_OIL | Status: DC | PRN
  Administered 2016-07-02: 1 via TOPICAL
  Filled 2016-06-30: qty 120

## 2016-06-30 NOTE — MAU Note (Signed)
Pt presents to MAU with contractions and pressure. Reports rupture of clear fluid around 1230. 4.5cm this morning at office visit.

## 2016-06-30 NOTE — Lactation Note (Signed)
This note was copied from a baby's chart. Lactation Consultation Note  Patient Name: Dawn Dickson [id number] Date: 06/30/2016 Reason for consult: Initial assessment Baby at 7 hr of life. Mom reports baby is latching well. She stated he is on and off the breast for 30-50 minutes at time. She denies breast or nipple pain. Discussed baby behavior and feeding frequency. MD came to check baby. Since mom did not have any lactation concerns. Left handouts and instructions to call at next feeding.   Maternal Data    Feeding Feeding Type: Breast Fed Length of feed: 5 min (on and off per mom, sleepy )  LATCH Score/Interventions                      Lactation Tools Discussed/Used     Consult Status Consult Status: Follow-up Date: 07/01/16 Follow-up type: In-patient    Denzil Hughes 06/30/2016, 8:57 PM

## 2016-06-30 NOTE — H&P (Signed)
Dawn Dickson is a 30 y.o. female presenting at 37.3 weeks in active labor. She was 5 cm in office this morning. SROM at home and presented to MAU, was 6 cm, progressed to complete in 30 min.   H/o LEEP, dynamic cervix. Prior term SVD after IOL at 38.5 wks for advanced cervical dilatation but preterm cervical dilatation starting at 34 wks.  In this pregnancy she received 17-Prog injections from 18 wks until 36 wks. Started Procardia 10mg  qid for contractions from 36 wks and had tapered down to bid this week.  GBS(-).   OB History    Gravida Para Term Preterm AB Living   2 1 1     1    SAB TAB Ectopic Multiple Live Births         0 1     Past Medical History:  Diagnosis Date  . Vaginal bleeding during pregnancy, antepartum 11/10/2014   Past Surgical History:  Procedure Laterality Date  . LEEP  2009  . WISDOM TOOTH EXTRACTION     Family History: family history is not on file. Social History:  reports that she has never smoked. She has never used smokeless tobacco. She reports that she does not drink alcohol or use drugs.     Maternal Diabetes: No Genetic Screening: Normal Ultrascreen neg and AFP1 normal  Maternal Ultrasounds/Referrals: Normal Fetal Ultrasounds or other Referrals:  None Maternal Substance Abuse:  No Significant Maternal Medications:  Meds include: Progesterone injections from 18-36 wks. Procardia 10mg  QID from 35-36 wks for contractions. Rhogam and TDAP Significant Maternal Lab Results:  Lab values include: Group B Strep negative, Rh negative.  Other Comments:  None  ROS  History Dilation: 10 Effacement (%): 100 Station: +2 Exam by:: dr Jesus Poplin Blood pressure 128/83, pulse 101, temperature 97.6 F (36.4 C), resp. rate 20, unknown if currently breastfeeding. Exam Physical Exam  Physical exam:  A&O x 3, no acute distress. Pleasant HEENT neg, no thyromegaly Lungs CTA bilat CV RRR, S1S2 normal Abdo soft, non tender, non acute Extr no edema/  tenderness Pelvic above FHT  130/ category I Toco q 3-4 min  Prenatal labs: ABO, Rh:  B(-) Antibody:  Neg Rubella:  Immune RPR:   NR HBsAg:   Neg HIV:   Neg GBS:   Neg  GLucola normal  Assessment/Plan: 30 yo G2P1001, 37.3 wks, active advanced labor. Desires epidural but may not be possible due to rapid progress, CBC pending. Reviewed pudendal block and nitrous oxide for pain mngmt, pt agrees. FHT category I. Anticipate SVD 7.1/2 lbs   Tyrik Stetzer R 06/30/2016

## 2016-06-30 NOTE — MAU Note (Signed)
Dr. Benjie Karvonen called MAU at 1308 and stated she would like for pt to be directly admitted to [facility] due to advanced dilation, was 4-5 in office and 5-6cm on arrival.

## 2016-07-01 ENCOUNTER — Encounter (HOSPITAL_COMMUNITY): Payer: Self-pay | Admitting: Obstetrics and Gynecology

## 2016-07-01 LAB — RPR: RPR Ser Ql: NONREACTIVE

## 2016-07-01 NOTE — Progress Notes (Signed)
Patient ID: Dawn Dickson, female   DOB: [birth year], 30 y.o.   MRN: [id number] PPD # 1 SVD  S:  Reports feeling well.             Tolerating po/ No nausea or vomiting             Bleeding is light             Pain controlled with ibuprofen (OTC)             Up ad lib / ambulatory / voiding without difficulties    Newborn  Information for the patient's newborn:  Dawn Dickson, [place]ie[place]ner [id number]  Female - "Dawn Dickson"  breast feeding  / Circumcision planning     O:  A & O x 3, in no apparent distress              VS:  Vitals:   06/30/16 1516 06/30/16 1545 06/30/16 1645 06/30/16 2100  BP: 131/75 110/72 (!) 114/59 120/70  Pulse: 65 64 72 68  Resp:  18 18 18   Temp:  97.5 F (36.4 C) 98 F (36.7 C) 97.9 F (36.6 C)  TempSrc:  Oral Oral Oral    LABS:  Recent Labs  06/30/16 1320  WBC 11.3*  HGB 13.3  HCT 38.6  PLT 251    Blood type: --/--/B NEG (08/08 1320) / Infant Rh NEG - No Rhophylac indicated  Rubella:     I&O: I/O last 3 completed shifts: In: -  Out: 200 [Blood:200]          No intake/output data recorded.  Lungs: Clear and unlabored  Heart: regular rate and rhythm / no murmurs  Abdomen: soft, non-tender, non-distended             Fundus: firm, non-tender, U-1  Perineum: intact, no edema  Lochia: minimal  Extremities: No edema, no calf pain or tenderness, No Homans    A/P: PPD # 1 30 y.o., [id number]   Principal Problem:    Postpartum care following vaginal delivery (8/8)  Active Problems:    Rh Negative affecting pregnancy, delivered   Doing well - stable status  Routine post partum orders  Anticipate discharge tomorrow    Laury Deep, M, MSN, CNM 07/01/2016, 8:13 AM

## 2016-07-02 MED ORDER — COCONUT OIL OIL: 1.0000 "application " | TOPICAL_OIL | 0 refills | Status: AC | PRN

## 2016-07-02 MED ORDER — ACETAMINOPHEN 325 MG PO TABS: 650.0000 mg | ORAL_TABLET | ORAL | Status: AC | PRN

## 2016-07-02 MED ORDER — IBUPROFEN 600 MG PO TABS: 600.0000 mg | ORAL_TABLET | Freq: Four times a day (QID) | ORAL | 0 refills | Status: DC

## 2016-07-02 NOTE — Lactation Note (Signed)
This note was copied from a baby's chart. Lactation Consultation Note  Patient Name: Dawn Dickson [id number] Date: 07/02/2016 Reason for consult: Follow-up assessment;Breast/nipple pain Assisted Mom with obtaining more depth with initial latch. LC observed baby "nibbling" to obtain depth with latch and feels this contributing to Mom's discomfort. Reviewed how to wait for baby to have wide open mouth and to un-tuck lips for baby to obtain depth with initial latch. Mom reports less discomfort. Mom applying EBM, comfort gels given with instructions. Advised Mom of OP services and advised to call for appointment if nipple pain not improving or getting worse in the next 1-2 days. Advised of support group. Mom denies other questions/concerns.   Maternal Data    Feeding Feeding Type: Breast Fed Length of feed: 5 min  LATCH Score/Interventions Latch: Grasps breast easily, tongue down, lips flanged, rhythmical sucking. Intervention(s): Adjust position;Assist with latch;Breast compression  Audible Swallowing: A few with stimulation  Type of Nipple: Everted at rest and after stimulation  Comfort (Breast/Nipple): Filling, red/small blisters or bruises, mild/mod discomfort  Problem noted: Mild/Moderate discomfort Interventions (Mild/moderate discomfort): Hand expression;Hand massage;Comfort gels  Hold (Positioning): Assistance needed to correctly position infant at breast and maintain latch. Intervention(s): Breastfeeding basics reviewed;Support Pillows;Position options;Skin to skin  LATCH Score: 7  Lactation Tools Discussed/Used Tools: Comfort gels   Consult Status Consult Status: Complete Date: 07/02/16 Follow-up type: In-patient    Katrine Coho 07/02/2016, 9:34 AM

## 2016-07-02 NOTE — Progress Notes (Signed)
Post Partum Day #2            Information for the patient's newborn:  Shyane, Dattilo [id number]  female   / circumcision completed Feeding: breast  Subjective: No HA, SOB, CP, F/C, breast symptoms. Pain minimal. Normal vaginal bleeding, no clots.    Ready for discharge home.    Objective:  Temp:  [97.5 F (36.4 C)-98.3 F (36.8 C)] 98.3 F (36.8 C) (08/10 0511) Pulse Rate:  [55-59] 55 (08/10 0511) Resp:  [18] 18 (08/10 0511) BP: (98-109)/(66-67) 98/67 (08/10 0511)  No intake or output data in the 24 hours ending 07/02/16 0858     Recent Labs  06/30/16 1320  WBC 11.3*  HGB 13.3  HCT 38.6  PLT 251    Blood type: --/--/B NEG (08/08 1320) / newborn Rh negative Rubella:   immune   Physical Exam:  General: alert, cooperative and no distress Uterine Fundus: firm Lochia: appropriate Perineum: intact, edema none DVT Evaluation: No evidence of DVT seen on physical exam.    Assessment/Plan: PPD # 2 / 30 y.o., [id number] S/P:spontaneous vaginal   Principal Problem:   Postpartum care following vaginal delivery (8/8) RH negative / newborn Rh negative - no need for Rhogam   normal postpartum exam  Continue current postpartum care  D/C home   LOS: 2 days   Juliene Pina, CNM, MSN 07/02/2016, 8:58 AM

## 2016-07-02 NOTE — Discharge Instructions (Signed)
Breast Pumping Tips °If you are breastfeeding, there may be times when you cannot feed your baby directly. Returning to work or going on a trip are common examples. Pumping allows you to store breast milk and feed it to your baby later.  °You may not get much milk when you first start to pump. Your breasts should start to make more after a few days. If you pump at the times you usually feed your baby, you may be able to keep making enough milk to feed your baby without also using formula. The more often you pump, the more milk you will produce.  °WHEN SHOULD I PUMP?  °· You can begin to pump soon after delivery. However, some experts recommend waiting about 4 weeks before giving your infant a bottle to make sure breastfeeding is going well.  °· If you plan to return to work, begin pumping a few weeks before. This will help you develop techniques that work best for you. It also lets you build up a supply of breast milk.   °· When you are with your infant, feed on demand and pump after each feeding.   °· When you are away from your infant for several hours, pump for about 15 minutes every 2-3 hours. Pump both breasts at the same time if you can.   °· If your infant has a formula feeding, make sure to pump around the same time.     °· If you drink any alcohol, wait 2 hours before pumping.   °HOW DO I PREPARE TO PUMP? °Your let-down reflex is the natural reaction to stimulation that makes your breast milk flow. It is easier to stimulate this reflex when you are relaxed. Find relaxation techniques that work for you. If you have difficulty with your let-down reflex, try these methods:  °· Smell one of your infant's blankets or an item of clothing.   °· Look at a picture or video of your infant.   °· Sit in a quiet, private space.   °· Massage the breast you plan to pump.   °· Place soothing warmth on the breast.   °· Play relaxing music.   °WHAT ARE SOME GENERAL BREAST PUMPING TIPS? °· Wash your hands before you pump. You  do not need to wash your nipples or breasts. °· There are three ways to pump. °¨ You can use your hand to massage and compress your breast. °¨ You can use a handheld manual pump. °¨ You can use an electric pump.   °· Make sure the suction cup (flange) on the breast pump is the right size. Place the flange directly over the nipple. If it is the wrong size or placed the wrong way, it may be painful and cause nipple damage.   °· If pumping is uncomfortable, apply a small amount of purified or modified lanolin to your nipple and areola. °· If you are using an electric pump, adjust the speed and suction power to be more comfortable. °· If pumping is painful or if you are not getting very much milk, you may need a different type of pump. A lactation consultant can help you determine what type of pump to use.   °· Keep a full water bottle near you at all times. Drinking lots of fluid helps you make more milk.  °· You can store your milk to use later. Pumped breast milk can be stored in a sealable, sterile container or plastic bag. Label all stored breast milk with the date you pumped it. °¨ Milk can stay out at room temperature for up to 8 hours. °¨   You can store your milk in the refrigerator for up to 8 days.  You can store your milk in the freezer for 3 months. Thaw frozen milk using warm water. Do not put it in the microwave.  Do not smoke. Smoking can lower your milk supply and harm your infant. If you need help quitting, ask your health care provider to recommend a program.  [place] A LACTATION CONSULTANT?  You are having trouble pumping.  You are concerned that you are not making enough milk.  You have nipple pain, soreness, or redness.  You want to use birth control. Birth control pills may lower your milk supply. Talk to your health care provider about your options.   This information is not intended to replace advice given to you by your health care provider.  Make sure you discuss any questions you have with your health care provider.   Document Released: 04/29/2010 Document Revised: 11/14/2013 Document Reviewed: 09/01/2013 Elsevier Interactive Patient Education [employer].

## 2016-07-02 NOTE — Discharge Summary (Signed)
Obstetric Discharge Summary Reason for Admission: onset of labor Prenatal Procedures: NST, ultrasound and Makena Inj. Intrapartum Procedures: spontaneous vaginal delivery Postpartum Procedures: none Complications-Operative and Postpartum: none Hemoglobin  Date Value Ref Range Status  06/30/2016 13.3 12.0 - 15.0 g/dL Final   HCT  Date Value Ref Range Status  06/30/2016 38.6 36.0 - 46.0 % Final    Physical Exam:  General: alert, cooperative and no distress Lochia: appropriate Uterine Fundus: firm Incision: NA DVT Evaluation: No evidence of DVT seen on physical exam. No significant calf/ankle edema.  Discharge Diagnoses: Term Pregnancy-delivered  Discharge Information: Date: 07/02/2016 Activity: pelvic rest Diet: routine Medications: PNV and Ibuprofen Condition: stable Instructions: refer to practice specific booklet Discharge to: home Follow-up D'Hanis, MD. Schedule an appointment as soon as possible for a visit in 6 week(s).   Specialty:  Obstetrics and Gynecology Why:  Postpartum visit Contact information: [place] [address] [phone]           Newborn Data: Live born female "[name]" Birth Weight: 7 lb 12.5 oz (3530 g) APGAR: 8, 9  Home with mother.  Juliene Pina, CNM, MSN 07/02/2016, 9:09 AM

## 2016-07-02 NOTE — Lactation Note (Signed)
This note was copied from a baby's chart. Lactation Consultation Note  Patient Name: Dawn Dickson [id number] Date: 07/02/2016 Reason for consult: Follow-up assessment Mom reports baby is nursing well, but c/o of nipple tenderness with some scabbing on left nipple. Mom reports she used nipple shield with 1st baby but would prefer not to use nipple shield with this baby. Baby is nursing frequently, has good output. Advised to continue to BF with feeding ques, 8-12 times or more in 24 hours. Discussed early term behaviors. Engorgement care reviewed if needed. Gilbert left phone number for Mom to call with next feeding for LC to observe latch due to nipple tenderness/trauma. Advised to apply EBM to sore nipples, ask RN for coconut oil to use prn.   Maternal Data    Feeding Feeding Type: Breast Fed  LATCH Score/Interventions Latch: Grasps breast easily, tongue down, lips flanged, rhythmical sucking.  Audible Swallowing: Spontaneous and intermittent  Type of Nipple: Everted at rest and after stimulation  Comfort (Breast/Nipple): Filling, red/small blisters or bruises, mild/mod discomfort     Hold (Positioning): No assistance needed to correctly position infant at breast.  LATCH Score: 9  Lactation Tools Discussed/Used     Consult Status Consult Status: Follow-up Date: 07/02/16 Follow-up type: In-patient    Katrine Coho 07/02/2016, 8:40 AM

## 2016-07-04 LAB — TYPE AND SCREEN
ABO/RH(D): B NEG
ANTIBODY SCREEN: POSITIVE
DAT, IGG: NEGATIVE
Unit division: 0
Unit division: 0

## 2017-05-19 DIAGNOSIS — Z6821 Body mass index (BMI) 21.0-21.9, adult: Secondary | ICD-10-CM | POA: Diagnosis not present

## 2017-05-19 DIAGNOSIS — R21 Rash and other nonspecific skin eruption: Secondary | ICD-10-CM | POA: Diagnosis not present

## 2017-08-19 DIAGNOSIS — Z6821 Body mass index (BMI) 21.0-21.9, adult: Secondary | ICD-10-CM | POA: Diagnosis not present

## 2017-08-19 DIAGNOSIS — Z01419 Encounter for gynecological examination (general) (routine) without abnormal findings: Secondary | ICD-10-CM | POA: Diagnosis not present

## 2017-08-31 DIAGNOSIS — Z0000 Encounter for general adult medical examination without abnormal findings: Secondary | ICD-10-CM | POA: Diagnosis not present

## 2017-09-07 DIAGNOSIS — Z1389 Encounter for screening for other disorder: Secondary | ICD-10-CM | POA: Diagnosis not present

## 2017-09-07 DIAGNOSIS — R42 Dizziness and giddiness: Secondary | ICD-10-CM | POA: Diagnosis not present

## 2017-09-07 DIAGNOSIS — L812 Freckles: Secondary | ICD-10-CM | POA: Diagnosis not present

## 2017-09-07 DIAGNOSIS — Z6820 Body mass index (BMI) 20.0-20.9, adult: Secondary | ICD-10-CM | POA: Diagnosis not present

## 2017-09-07 DIAGNOSIS — Z0000 Encounter for general adult medical examination without abnormal findings: Secondary | ICD-10-CM | POA: Diagnosis not present

## 2017-11-18 DIAGNOSIS — D2261 Melanocytic nevi of right upper limb, including shoulder: Secondary | ICD-10-CM | POA: Diagnosis not present

## 2017-11-18 DIAGNOSIS — D225 Melanocytic nevi of trunk: Secondary | ICD-10-CM | POA: Diagnosis not present

## 2017-11-18 DIAGNOSIS — D2262 Melanocytic nevi of left upper limb, including shoulder: Secondary | ICD-10-CM | POA: Diagnosis not present

## 2019-11-24 NOTE — L&D Delivery Note (Signed)
Delivery Note At 6:21 PM a viable female was delivered via  (Presentation: DOA     ).  APGAR:per nursing, good color/ tone/ cry. NICU in attendance but did not formally assess baby  , ; weight pending .   Placenta status: Spontaneous, Intact.  Cord: 3VC  with the following complications: none .  Cord pH: n/a  Anesthesia:  none Episiotomy:  none Lacerations:  none Suture Repair: n/a Est. Blood Loss (mL):  250  Mom to postpartum.  Baby to Couplet care / Skin to Skin.  Ala Dach 09/03/2020, 6:27 PM

## 2020-03-20 ENCOUNTER — Ambulatory Visit: Payer: 59 | Attending: Internal Medicine

## 2020-03-20 DIAGNOSIS — Z20822 Contact with and (suspected) exposure to covid-19: Secondary | ICD-10-CM

## 2020-03-21 LAB — NOVEL CORONAVIRUS, NAA: SARS-CoV-2, NAA: NOT DETECTED

## 2020-03-21 LAB — SARS-COV-2, NAA 2 DAY TAT

## 2020-09-03 ENCOUNTER — Encounter (HOSPITAL_COMMUNITY): Payer: Self-pay

## 2020-09-03 ENCOUNTER — Other Ambulatory Visit: Payer: Self-pay

## 2020-09-03 ENCOUNTER — Inpatient Hospital Stay (HOSPITAL_BASED_OUTPATIENT_CLINIC_OR_DEPARTMENT_OTHER): Payer: 59

## 2020-09-03 ENCOUNTER — Inpatient Hospital Stay (HOSPITAL_COMMUNITY)
Admission: AD | Admit: 2020-09-03 | Discharge: 2020-09-05 | DRG: 807 | Disposition: A | Discharge status: Home / Self Care | Payer: 59 | Attending: Obstetrics & Gynecology | Admitting: Obstetrics & Gynecology

## 2020-09-03 DIAGNOSIS — O26899 Other specified pregnancy related conditions, unspecified trimester: Secondary | ICD-10-CM

## 2020-09-03 DIAGNOSIS — Z6791 Unspecified blood type, Rh negative: Secondary | ICD-10-CM | POA: Diagnosis not present

## 2020-09-03 DIAGNOSIS — O42913 Preterm premature rupture of membranes, unspecified as to length of time between rupture and onset of labor, third trimester: Principal | ICD-10-CM | POA: Diagnosis present

## 2020-09-03 DIAGNOSIS — Z363 Encounter for antenatal screening for malformations: Secondary | ICD-10-CM

## 2020-09-03 DIAGNOSIS — O26893 Other specified pregnancy related conditions, third trimester: Secondary | ICD-10-CM | POA: Diagnosis present

## 2020-09-03 DIAGNOSIS — Z3A35 35 weeks gestation of pregnancy: Secondary | ICD-10-CM

## 2020-09-03 DIAGNOSIS — Z23 Encounter for immunization: Secondary | ICD-10-CM

## 2020-09-03 DIAGNOSIS — O3663X0 Maternal care for excessive fetal growth, third trimester, not applicable or unspecified: Secondary | ICD-10-CM | POA: Diagnosis present

## 2020-09-03 DIAGNOSIS — Z20822 Contact with and (suspected) exposure to covid-19: Secondary | ICD-10-CM | POA: Diagnosis present

## 2020-09-03 DIAGNOSIS — O4290 Premature rupture of membranes, unspecified as to length of time between rupture and onset of labor, unspecified weeks of gestation: Secondary | ICD-10-CM | POA: Diagnosis present

## 2020-09-03 DIAGNOSIS — O358XX0 Maternal care for other (suspected) fetal abnormality and damage, not applicable or unspecified: Secondary | ICD-10-CM | POA: Diagnosis present

## 2020-09-03 HISTORY — DX: Other specified pregnancy related conditions, unspecified trimester: O26.899

## 2020-09-03 HISTORY — DX: Unspecified blood type, rh negative: Z67.91

## 2020-09-03 LAB — CBC
HCT: 34.3 % — ABNORMAL LOW (ref 36.0–46.0)
Hemoglobin: 11 g/dL — ABNORMAL LOW (ref 12.0–15.0)
MCH: 25.6 pg — ABNORMAL LOW (ref 26.0–34.0)
MCHC: 32.1 g/dL (ref 30.0–36.0)
MCV: 80 fL (ref 80.0–100.0)
Platelets: 204 10*3/uL (ref 150–400)
RBC: 4.29 MIL/uL (ref 3.87–5.11)
RDW: 15.2 % (ref 11.5–15.5)
WBC: 7.1 10*3/uL (ref 4.0–10.5)
nRBC: 0 % (ref 0.0–0.2)

## 2020-09-03 LAB — TYPE AND SCREEN
ABO/RH(D): B NEG
Antibody Screen: POSITIVE

## 2020-09-03 LAB — RPR: RPR Ser Ql: NONREACTIVE

## 2020-09-03 LAB — RESPIRATORY PANEL BY RT PCR (FLU A&B, COVID)
Influenza A by PCR: NEGATIVE
Influenza B by PCR: NEGATIVE
SARS Coronavirus 2 by RT PCR: NEGATIVE

## 2020-09-03 LAB — POCT FERN TEST: POCT Fern Test: POSITIVE

## 2020-09-03 MED ORDER — ONDANSETRON HCL 4 MG/2ML IJ SOLN: 4.0000 mg | Freq: Four times a day (QID) | INTRAMUSCULAR | Status: DC | PRN

## 2020-09-03 MED ORDER — SIMETHICONE 80 MG PO CHEW: 80.0000 mg | CHEWABLE_TABLET | ORAL | Status: DC | PRN

## 2020-09-03 MED ORDER — DIPHENHYDRAMINE HCL 25 MG PO CAPS: 25.0000 mg | ORAL_CAPSULE | Freq: Four times a day (QID) | ORAL | Status: DC | PRN

## 2020-09-03 MED ORDER — PHENYLEPHRINE 40 MCG/ML (10ML) SYRINGE FOR IV PUSH (FOR BLOOD PRESSURE SUPPORT): 80.0000 ug | PREFILLED_SYRINGE | INTRAVENOUS | Status: DC | PRN

## 2020-09-03 MED ORDER — SODIUM CHLORIDE 0.9 % IV SOLN: 2.0000 g | Freq: Once | INTRAVENOUS | Status: DC

## 2020-09-03 MED ORDER — LACTATED RINGERS IV BOLUS
500.0000 mL | Freq: Once | INTRAVENOUS | Status: AC
  Administered 2020-09-03: 500 mL via INTRAVENOUS

## 2020-09-03 MED ORDER — LACTATED RINGERS IV SOLN: INTRAVENOUS | Status: DC

## 2020-09-03 MED ORDER — INFLUENZA VAC SPLIT QUAD 0.5 ML IM SUSY
0.5000 mL | PREFILLED_SYRINGE | INTRAMUSCULAR | Status: AC
  Administered 2020-09-05: 0.5 mL via INTRAMUSCULAR
  Filled 2020-09-03: qty 0.5

## 2020-09-03 MED ORDER — ONDANSETRON HCL 4 MG/2ML IJ SOLN: 4.0000 mg | INTRAMUSCULAR | Status: DC | PRN

## 2020-09-03 MED ORDER — OXYTOCIN-SODIUM CHLORIDE 30-0.9 UT/500ML-% IV SOLN
2.5000 [IU]/h | INTRAVENOUS | Status: DC
  Filled 2020-09-03: qty 500

## 2020-09-03 MED ORDER — TETANUS-DIPHTH-ACELL PERTUSSIS 5-2.5-18.5 LF-MCG/0.5 IM SUSP: 0.5000 mL | Freq: Once | INTRAMUSCULAR | Status: DC

## 2020-09-03 MED ORDER — SOD CITRATE-CITRIC ACID 500-334 MG/5ML PO SOLN: 30.0000 mL | ORAL | Status: DC | PRN

## 2020-09-03 MED ORDER — SODIUM CHLORIDE 0.9 % IV SOLN
1.0000 g | INTRAVENOUS | Status: DC
  Filled 2020-09-03 (×3): qty 1000

## 2020-09-03 MED ORDER — COCONUT OIL OIL
1.0000 "application " | TOPICAL_OIL | Status: DC | PRN
  Administered 2020-09-04: 1 via TOPICAL

## 2020-09-03 MED ORDER — OXYCODONE-ACETAMINOPHEN 5-325 MG PO TABS: 1.0000 | ORAL_TABLET | ORAL | Status: DC | PRN

## 2020-09-03 MED ORDER — DIBUCAINE (PERIANAL) 1 % EX OINT: 1.0000 "application " | TOPICAL_OINTMENT | CUTANEOUS | Status: DC | PRN

## 2020-09-03 MED ORDER — BETAMETHASONE SOD PHOS & ACET 6 (3-3) MG/ML IJ SUSP
12.0000 mg | INTRAMUSCULAR | Status: DC
  Administered 2020-09-03: 12 mg via INTRAMUSCULAR
  Filled 2020-09-03: qty 5

## 2020-09-03 MED ORDER — EPHEDRINE 5 MG/ML INJ: 10.0000 mg | INTRAVENOUS | Status: DC | PRN

## 2020-09-03 MED ORDER — CALCIUM CARBONATE ANTACID 500 MG PO CHEW: 2.0000 | CHEWABLE_TABLET | ORAL | Status: DC | PRN

## 2020-09-03 MED ORDER — PRENATAL MULTIVITAMIN CH
1.0000 | ORAL_TABLET | Freq: Every day | ORAL | Status: DC
  Administered 2020-09-04 – 2020-09-05 (×2): 1 via ORAL
  Filled 2020-09-03 (×2): qty 1

## 2020-09-03 MED ORDER — DOCUSATE SODIUM 100 MG PO CAPS
100.0000 mg | ORAL_CAPSULE | Freq: Every day | ORAL | Status: DC
  Administered 2020-09-03: 100 mg via ORAL
  Filled 2020-09-03: qty 1

## 2020-09-03 MED ORDER — ACETAMINOPHEN 325 MG PO TABS: 650.0000 mg | ORAL_TABLET | ORAL | Status: DC | PRN

## 2020-09-03 MED ORDER — SENNOSIDES-DOCUSATE SODIUM 8.6-50 MG PO TABS
2.0000 | ORAL_TABLET | ORAL | Status: DC
  Administered 2020-09-03: 2 via ORAL
  Filled 2020-09-03 (×2): qty 2

## 2020-09-03 MED ORDER — ONDANSETRON HCL 4 MG PO TABS: 4.0000 mg | ORAL_TABLET | ORAL | Status: DC | PRN

## 2020-09-03 MED ORDER — OXYCODONE HCL 5 MG PO TABS: 10.0000 mg | ORAL_TABLET | ORAL | Status: DC | PRN

## 2020-09-03 MED ORDER — ZOLPIDEM TARTRATE 5 MG PO TABS: 5.0000 mg | ORAL_TABLET | Freq: Every evening | ORAL | Status: DC | PRN

## 2020-09-03 MED ORDER — LACTATED RINGERS IV SOLN: 500.0000 mL | INTRAVENOUS | Status: DC | PRN

## 2020-09-03 MED ORDER — OXYTOCIN BOLUS FROM INFUSION
333.0000 mL | Freq: Once | INTRAVENOUS | Status: AC
  Administered 2020-09-03: 333 mL via INTRAVENOUS

## 2020-09-03 MED ORDER — IBUPROFEN 600 MG PO TABS
600.0000 mg | ORAL_TABLET | Freq: Four times a day (QID) | ORAL | Status: DC
  Administered 2020-09-03 – 2020-09-05 (×6): 600 mg via ORAL
  Filled 2020-09-03 (×7): qty 1

## 2020-09-03 MED ORDER — OXYCODONE HCL 5 MG PO TABS: 5.0000 mg | ORAL_TABLET | ORAL | Status: DC | PRN

## 2020-09-03 MED ORDER — WITCH HAZEL-GLYCERIN EX PADS: 1.0000 "application " | MEDICATED_PAD | CUTANEOUS | Status: DC | PRN

## 2020-09-03 MED ORDER — LIDOCAINE HCL (PF) 1 % IJ SOLN: 30.0000 mL | INTRAMUSCULAR | Status: DC | PRN

## 2020-09-03 MED ORDER — PRENATAL MULTIVITAMIN CH
1.0000 | ORAL_TABLET | Freq: Every day | ORAL | Status: DC
  Administered 2020-09-03: 1 via ORAL
  Filled 2020-09-03: qty 1

## 2020-09-03 MED ORDER — ACETAMINOPHEN 325 MG PO TABS
650.0000 mg | ORAL_TABLET | ORAL | Status: DC | PRN
  Administered 2020-09-03: 650 mg via ORAL
  Filled 2020-09-03: qty 2

## 2020-09-03 MED ORDER — BENZOCAINE-MENTHOL 20-0.5 % EX AERO: 1.0000 "application " | INHALATION_SPRAY | CUTANEOUS | Status: DC | PRN

## 2020-09-03 MED ORDER — DIPHENHYDRAMINE HCL 50 MG/ML IJ SOLN: 12.5000 mg | INTRAMUSCULAR | Status: DC | PRN

## 2020-09-03 MED ORDER — FENTANYL-BUPIVACAINE-NACL 0.5-0.125-0.9 MG/250ML-% EP SOLN
EPIDURAL | Status: AC
  Filled 2020-09-03: qty 250

## 2020-09-03 MED ORDER — LACTATED RINGERS IV SOLN: 500.0000 mL | Freq: Once | INTRAVENOUS | Status: DC

## 2020-09-03 MED ORDER — OXYCODONE-ACETAMINOPHEN 5-325 MG PO TABS: 2.0000 | ORAL_TABLET | ORAL | Status: DC | PRN

## 2020-09-03 MED ORDER — FENTANYL-BUPIVACAINE-NACL 0.5-0.125-0.9 MG/250ML-% EP SOLN: 12.0000 mL/h | EPIDURAL | Status: DC | PRN

## 2020-09-03 NOTE — Progress Notes (Addendum)
Contact with Dr. Pamala Hurry in regards to patient having increased contraction pains. Patient rates them 6/10. Instructed by Dr. Pamala Hurry to give patient 1000cc LR bolus and keep patient on continuous monitoring. [place]

## 2020-09-03 NOTE — H&P (Addendum)
Dawn Dickson is a 34 y.o. female G3P2002 at 35 wks, presenting for PROM at midnight. No UCs. Slight pink tinge but no bleeding. +FMs.  [facility]/ Dr Benjie Karvonen.  H/o LEEP and Rh neg.  H/o preterm labor with 1st kkid, delivered at term but was on bedrest due to cervical dilatation. So took Makena from 16 wks in 2nd preg and also in this pregnancy.  This baby was LGA at 95% with AC at 98% at 28 wks for S>D, repeat sono was scheduled this week.  Prior SVDs x 2.   OB History    Gravida  3   Para  2   Term  2   Preterm      AB      Living  2     SAB      TAB      Ectopic      Multiple  0   Live Births  2          Past Medical History:  Diagnosis Date  . Postpartum care following vaginal delivery (8/8) 07/01/2016  . Vaginal bleeding during pregnancy, antepartum 11/10/2014   Past Surgical History:  Procedure Laterality Date  . LEEP  2009  . WISDOM TOOTH EXTRACTION     Family History: family history is not on file. Social History:  reports that she has never smoked. She has never used smokeless tobacco. She reports that she does not drink alcohol and does not use drugs.     Maternal Diabetes: No Genetic Screening: Normal NIPS Maternal Ultrasounds/Referrals: Normal. Fetal Pyelectasis. Fetal Ultrasounds or other Referrals:  None Maternal Substance Abuse:  No Significant Maternal Medications:  Meds include: Other: 17-OhP Makena progesterone injections weekly from 16 wks. Rhogam at 28wks.  Significant Maternal Lab Results:  None- collected at admission  Other Comments:  None  Review of Systems History   Blood pressure 120/75, pulse 74, temperature 98.2 F (36.8 C), temperature source Oral, resp. rate 18, height 5\' 9"  (1.753 m), weight 88.5 kg, SpO2 96 %, unknown if currently breastfeeding. Exam Physical Exam  A&O x 3, no acute distress. Pleasant HEENT neg, no thyromegaly Lungs CTA bilat CV RRR, S1S2 normal Abdo soft, non tender, non acute Extr no edema/  tenderness Pelvic Cx closed on sterile speculum exam  FHT  140s + accels no decels mod variab- cat I Toco rare   Prenatal labs: ABO, Rh: --/--/B NEG (10/12 0220) Antibody: POS (10/12 0220) Rubella:  Imm RPR:   Neg HBsAg:   Neg HIV:   Neg GBS:   collected at admission  Glucola nl  NIPS nl, XY  Assessment/Plan: 34 yo G3P2002. 35 wks with PROM. No s/s of labor. FHT cat I. So planning expectant management till 36 wks and then induce, no tocolytic and no antibiotic at this point. Plan growth sono today.  Since CEFM is reassuring and reactive/ Catergory I, will space out to NST q shift BTMZ at admission and repeat in 24hrs for prematurity D/w pt, voiced understanding    Dawn Dickson 09/03/2020, 7:53 AM  Addendum--> fetal pyelectasis. Noted again at admission sono today. See MFM report.

## 2020-09-03 NOTE — MAU Note (Signed)
Pt reports to MAU complaining of SROM at 0050 pinkish fluid.  +FM. Denies vaginal bleeding. Reports occasional contractions.

## 2020-09-03 NOTE — Progress Notes (Signed)
LR infusion in progress Toya Smothers, RN

## 2020-09-03 NOTE — Progress Notes (Signed)
CTSP for ctx.  S: pt notes increase in ctx around 4 pm, progressively increasing in freq and intensity. +LOF, no VB  Vitals with BMI 09/03/2020 09/03/2020 09/03/2020  Height - - -  Weight - - -  BMI - - -  Systolic 677 373 668  Diastolic 78 71 75  Pulse 71 68 74   Gen: crying in bed, worried about inability to get epidural, uncomfortable with ctx Toco: q 22min FH: reactive  Cvx: per nurse 5/90/vtx  CBC    Component Value Date/Time   WBC 7.1 09/03/2020 0833   RBC 4.29 09/03/2020 0833   HGB 11.0 (L) 09/03/2020 0833   HCT 34.3 (L) 09/03/2020 0833   PLT 204 09/03/2020 0833   MCV 80.0 09/03/2020 0833   MCH 25.6 (L) 09/03/2020 0833   MCHC 32.1 09/03/2020 0833   RDW 15.2 09/03/2020 0833    A/P: preterm PROM now with  PTL. G3P2, 35 wks. S/p BMZ #1 at 1am. Will not tocolyze given GA. No evidence chorio at this moment but watch closely. GBS unknown (culture, not PCR, sent on admission), will treat with Ampicillin given preterm. Move to LDR, epidural now, prepare for delivery - reactive fetal testing.   Ala Dach 09/03/2020 5:52 PM

## 2020-09-04 NOTE — Progress Notes (Signed)
Fetal Pyelectasis- noted on 10/12 Sono, share with Peds

## 2020-09-04 NOTE — Lactation Note (Signed)
This note was copied from a baby's chart. Lactation Consultation Note  Patient Name: Dawn Dickson PNTIR'W Date: 34/13/2021 Reason for consult: Follow-up assessment;Late-preterm 34-36.6wks;NICU baby  P3 mother whose infant is now 4 hours old.  This is a LPTI at 35+1 weeks and in the NICU.  Mother breast fed her other two children (now 34 and 34 years old) for 4-5 months before returning to work.  Mother plans to breast feed this baby for 5 months until she returns to work.  RN requested latch assistance and was in room with mother when I arrived.  Baby was on the left breast asleep.  Discussed breast feeding expectations at this age and mother aware that baby will be very sleepy and may not want to latch at all.  I encouraged nuzzling and licking at the breast if he is interested.  Suggested STS and reviewed the LPTI guidelines as far as conserving baby's energy level.  Mother verbalized understanding.  Demonstrated breast compressions and gentle stimulation, however, baby was not interested in sucking.  Placed him STS on mother's chest and he fell asleep.  Mother has been set up with a DEBP in her room.  Encouraged pumping every three hours with breast massage and hand expression prior to and after pumping to help increase milk supply.  Mother has already been able to obtain a few mls of EBM with her last pumping session.  Praised mother for her efforts.    Discussed pumping at baby's bedside in the NICU, transporting pump pieces, providing any EBM obtained and calling to make an appointment as needed when baby is ready to latch.  Mother appreciative.  She has a DEBP for home use.   Maternal Data Formula Feeding for Exclusion: No Has patient been taught Hand Expression?: Yes Does the patient have breastfeeding experience prior to this delivery?: Yes  Feeding Feeding Type: Donor Breast Milk  LATCH Score Latch: Too sleepy or reluctant, no latch achieved, no sucking elicited.  Audible  Swallowing: None  Type of Nipple: Everted at rest and after stimulation  Comfort (Breast/Nipple): Soft / non-tender  Hold (Positioning): Assistance needed to correctly position infant at breast and maintain latch.  LATCH Score: 5  Interventions Interventions: Breast feeding basics reviewed;Assisted with latch;Skin to skin;Breast massage;Breast compression;DEBP;Hand pump;Support pillows  Lactation Tools Discussed/Used     Consult Status Consult Status: Follow-up Follow-up type: In-patient    Little Ishikawa 09/04/2020, 12:24 PM

## 2020-09-04 NOTE — Progress Notes (Signed)
Patient screened out for psychosocial assessment since none of the following apply:  Psychosocial stressors documented in mother or baby's chart  Gestation less than 32 weeks  Code at delivery   Infant with anomalies Please contact the Clinical Social Worker if specific needs arise, by MOB's request, or if MOB scores greater than 9/yes to question 10 on Edinburgh Postpartum Depression Screen.  Naylee Frankowski, LCSW Clinical Social Worker [facility] Cell#: [phone]     

## 2020-09-04 NOTE — Lactation Note (Signed)
This note was copied from a baby's chart. Lactation Consultation Note NICU baby 33 hrs old. Mom had been resting. Mom and FOB coming out of room going to NICU.  Introduced self, asked mom if she has pumped, mom stated no. Asked mom if she has a pump in her room, mom stated no. Asked mom if LC can set up DEBP in her room and when she comes back if she would call RN to ask how to use it. Mom stated OK. Told mom Lactation would be by to see her today to talk about pumping and milk storage for NICU baby. Mom stated OK.  [address] set up DEBP in room.  Lactation brochure at bedside.  Patient Name: Boy Koreen Lizaola KJIZX'Y Date: 09/04/2020 Reason for consult: Initial assessment;NICU baby;Late-preterm 34-36.6wks   Maternal Data    Feeding Feeding Type: Donor Breast Milk  LATCH Score                   Interventions Interventions: DEBP  Lactation Tools Discussed/Used     Consult Status Consult Status: Follow-up Date: 09/04/20 Follow-up type: In-patient    Theodoro Kalata 09/04/2020, 6:21 AM

## 2020-09-04 NOTE — Progress Notes (Signed)
PPD #1, SVD/PTB at 35 weeks, intact perineum, baby boy "Teddy" stable in NICU  S:  Reports feeling well, mild soreness              Tolerating po/ No nausea or vomiting / Denies dizziness or SOB             Bleeding is getting lighter             Pain controlled with Motrin              Up ad lib / ambulatory / voiding QS without difficulty   Newborn stable in NICU, mom pumping colostrum  / Circumcision - planning   O:               VS: BP 113/65 (BP Location: Right Arm)   Pulse 64   Temp 98 F (36.7 C) (Oral)   Resp 18   Ht 5\' 9"  (1.753 m)   Wt 88.5 kg   SpO2 99%   Breastfeeding Unknown   BMI 28.83 kg/m    LABS:             Recent Labs    09/03/20 0833  WBC 7.1  HGB 11.0*  PLT 204               Blood type: --/--/B NEG (10/12 0220)  Rubella:        Immune               I&O: Intake/Output      10/12 0701 - 10/13 0700 10/13 0701 - 10/14 0700   I.V. (mL/kg) 0 (0)    Total Intake(mL/kg) 0 (0)    Blood 250    Total Output 250    Net -250           Flu: planning prior to d/c Tdap: UTD COVID vax: UTD              Physical Exam:             Alert and oriented X3  Lungs: Clear and unlabored  Heart: regular rate and rhythm / no murmurs  Abdomen: soft, non-tender, non-distended              Fundus: firm, non-tender, U-1  Perineum: intact  Lochia: appropriate  Extremities: no edema, no calf pain or tenderness    A/P: PPD # 1, SVD/PTB, baby in NICU  RH Negative/Baby RH Negative   - No Rhogam indicated  Doing well - stable status  Routine post partum orders  Okay to d/c IV  Lactation support PRN  Flu vaccine prior to d/c  Anticipate d/c home tomorrow   Lars Pinks, MSN, CNM [facility]

## 2020-09-05 LAB — CULTURE, BETA STREP (GROUP B ONLY)

## 2020-09-05 MED ORDER — IBUPROFEN 600 MG PO TABS: 600.0000 mg | ORAL_TABLET | Freq: Four times a day (QID) | ORAL | 0 refills | Status: AC

## 2020-09-05 NOTE — Discharge Summary (Signed)
Postpartum Discharge Summary  Date of Service updated     Patient Name: Dawn Dickson DOB: [birth year] MRN: [id number]  Date of admission: 09/03/2020 Delivery date:09/03/2020  Delivering provider: Aloha Gell  Date of discharge: 09/05/2020  Admitting diagnosis: Premature rupture of membranes [O42.90] PROM (premature rupture of membranes) [O42.90] Intrauterine pregnancy: [redacted]w[redacted]d     Secondary diagnosis:  Principal Problem:   Postpartum care following vaginal delivery 10/12 Active Problems:   SVD (spontaneous vaginal delivery) PTB [redacted]w[redacted]d   Premature rupture of membranes   Rh negative, maternal  Additional problems: none    Discharge diagnosis: Preterm Pregnancy Delivered                                              Post partum procedures:none Augmentation: N/A Complications: None  Hospital course: Onset of Labor With Vaginal Delivery      34 y.o. yo [id number] at [redacted]w[redacted]d was admitted for PPROM on 09/03/2020. Patient was admitted for observation and bmz, seh then went into labor.  Patient had an uncomplicated labor course as follows:  Membrane Rupture Time/Date: 1:00 AM ,09/03/2020   Delivery Method:Vaginal, Spontaneous  Episiotomy: None  Lacerations:  None  Patient had an uncomplicated postpartum course.  She is ambulating, tolerating a regular diet, passing flatus, and urinating well. Patient is discharged home in stable condition on 09/05/20.  Newborn Data: Birth date:09/03/2020  Birth time:6:21 PM  Gender:Female  Living status:Living  Apgars:8 ,9  Weight:3240 g   Magnesium Sulfate received: No BMZ received: Yes Rhophylac:No  Physical exam  Vitals:   09/04/20 0554 09/04/20 0905 09/04/20 1302 09/05/20 0733  BP: 113/65 111/83 115/81 122/81  Pulse: 64 66 60 65  Resp: 18 18 18 18   Temp: 98 F (36.7 C) 97.8 F (36.6 C) 97.6 F (36.4 C) 97.7 F (36.5 C)  TempSrc: Oral Oral Oral Oral  SpO2:  100% 100%   Weight:      Height:       General: alert, cooperative  and no distress Lochia: appropriate Uterine Fundus: firm Incision: N/A DVT Evaluation: No evidence of DVT seen on physical exam. Labs: Lab Results  Component Value Date   WBC 7.1 09/03/2020   HGB 11.0 (L) 09/03/2020   HCT 34.3 (L) 09/03/2020   MCV 80.0 09/03/2020   PLT 204 09/03/2020   CMP Latest Ref Rng & Units 04/01/2015  Glucose 70 - 99 mg/dL 105(H)  BUN 6 - 20 mg/dL <5(L)  Creatinine 0.44 - 1.00 mg/dL 0.51  Sodium 135 - 145 mmol/L 133(L)  Potassium 3.5 - 5.1 mmol/L 3.9  Chloride 101 - 111 mmol/L 104  CO2 22 - 32 mmol/L 23  Calcium 8.9 - 10.3 mg/dL 8.7(L)  Total Protein 6.5 - 8.1 g/dL 5.8(L)  Total Bilirubin 0.3 - 1.2 mg/dL 0.6  Alkaline Phos 38 - 126 U/L 216(H)  AST 15 - 41 U/L 22  ALT 14 - 54 U/L 14   Edinburgh Score: Edinburgh Postnatal Depression Scale Screening Tool 09/05/2020  I have been able to laugh and see the funny side of things. 0  I have looked forward with enjoyment to things. 0  I have blamed myself unnecessarily when things went wrong. 0  I have been anxious or worried for no good reason. 0  I have felt scared or panicky for no good reason. 0  Things have been getting on top  of me. 0  I have been so unhappy that I have had difficulty sleeping. 0  I have felt sad or miserable. 0  I have been so unhappy that I have been crying. 0  The thought of harming myself has occurred to me. 0  Edinburgh Postnatal Depression Scale Total 0      After visit meds:  Allergies as of 09/05/2020   No Known Allergies     Medication List    TAKE these medications   acetaminophen 325 MG tablet Commonly known as: Tylenol Take 2 tablets (650 mg total) by mouth every 4 (four) hours as needed (for pain scale < 4).   CitraNatal 90 DHA 90-1 & 300 MG Misc Take 1 tablet by mouth daily.   coconut oil Oil Apply 1 application topically as needed.   ibuprofen 600 MG tablet Commonly known as: ADVIL Take 1 tablet (600 mg total) by mouth every 6 (six) hours.   omega-3  acid ethyl esters 1 g capsule Commonly known as: LOVAZA Take 1 g by mouth daily.        Discharge home in stable condition Infant Feeding: Bottle/in nicu, mom is pumping Infant Disposition:NICU Discharge instruction: per After Visit Summary and Postpartum booklet. Activity: Advance as tolerated. Pelvic rest for 6 weeks.  Diet: routine diet Anticipated Birth Control: Unsure Postpartum Appointment:6 weeks Additional Postpartum F/U: n/a Future Appointments:No future appointments. Follow up Visit:      09/05/2020 Charyl Bigger, MD

## 2020-09-05 NOTE — Progress Notes (Signed)
No c/o; tol po; voiding w/o difficulty, minimal cramping; normal lochia Pumping, baby stable in NICU, working on feeding  Patient Vitals for the past 24 hrs:  BP Temp Temp src Pulse Resp SpO2  09/05/20 0733 122/81 97.7 F (36.5 C) Oral 65 18 --  09/04/20 1302 115/81 97.6 F (36.4 C) Oral 60 18 100 %   A&ox3 rrr ctab Abd: soft, nt, nd; fundus firm and below umb LE: no edema, nt bilat  CBC Latest Ref Rng & Units 09/03/2020 06/30/2016 04/17/2015  WBC 4.0 - 10.5 K/uL 7.1 11.3(H) 9.5  Hemoglobin 12.0 - 15.0 g/dL 11.0(L) 13.3 13.2  Hematocrit 36 - 46 % 34.3(L) 38.6 39.1  Platelets 150 - 400 K/uL 204 251 190   A/P: ppd 2 s/p svd at 35wga 1. Doing well, d/c home today with f/u in office in 6 wks 2. Boy, NICU, planning circ when ready 3. Rh neg, baby rh neg and rhogam not indicated 4. RI

## 2020-09-05 NOTE — Lactation Note (Addendum)
This note was copied from a baby's chart. Lactation Consultation Note  Patient Name: Dawn Dickson [id number] Date: 09/05/2020 Reason for consult: Follow-up assessment;NICU baby;Late-preterm 34-36.6wks  LC in to visit with P3 Mom of LPTI at 49 hrs old.  Baby's weight today is 6 lbs 14.7 oz.  Mom holding baby STS in cradle hold.  Baby is going to breast with cues, and then gavage feeding full feedings of 22 cal fortified breast milk.  Mom is concerned about baby not breastfeeding well yet, few feeding readiness cues noted .    Sat down and reassured Mom about normal LPTI feeding behavior patterns.  Encouraged Mom to place baby prone on her chest with baby's head turned to enable baby's skin to be in contact with Mom's skin and also hear Mom's heart beat.  Mom about to go back to her room to be discharged, but she states she will do this next time.   Reviewed what feeding readiness would look like.  Baby sleeping and not showing any cues currently.  Talked about hand expressing drops onto her nipple and placing baby in position near her breast.  Talked about placing drops of colostrum on baby's lips.    Mom has a DEBP from her insurance company at home.  Mom made aware of benefit of using a hospital grade pump like the Symphony for the first few weeks to help establish a full milk supply.  Mom aware of pump in baby's room for her to use and rental of Symphony from hospital gift shop.  Mom encouraged to pump frequently every 2-3 hrs during the day and 3-4 hrs at night.  Reassured Mom that [place] would be there to support her efforts with establishing a full milk supply and helping baby breastfeed.  Mom aware of OP lactation support available and reassured her that baby would need supplementation until he is mature enough and strong enough to fully breastfeed, which normally is 38-40 weeks.   Mom reports some nipple soreness during pumping.  She increased her flange size on her left breast and states it  improved.  Mom denies any skin breakdown.  Mom to watch pump strength and turn it down prn.  Mom encouraged to use colostrum on nipples.    Interventions Interventions: Breast feeding basics reviewed;Skin to skin;Breast massage;Hand express;DEBP  Lactation Tools Discussed/Used Tools: Pump;Coconut oil Breast pump type: Double-Electric Breast Pump   Consult Status Consult Status: Follow-up Date: 09/06/20 Follow-up type: In-patient    [name] 09/05/2020, 10:08 AM

## 2020-09-09 ENCOUNTER — Ambulatory Visit: Payer: Self-pay

## 2020-09-09 NOTE — Lactation Note (Signed)
This note was copied from a baby's chart. Lactation Consultation Note  Patient Name: Dawn Dickson [id number] Date: 09/09/2020 Reason for consult: Follow-up assessment  P3 mother whose infant is now 17 days old.  This is a LPTI at 35+1 weeks with a CGA of 36+0 weeks and in the NICU.  Baby weighs 2970 grams today.  Mother is exclusively breast feeding.  Mother feels like baby has been latching and feeding well.  He is also receiving gavage feedings after breast feeding.  Mother questioning if she can stop pumping because he is feeding well.  I encouraged her to continue pumping after feedings to be sure that he continues to breast feed well and also to have supplementation if he needs this in the future.  Mother stated that sometimes he feeds and gets sleepy but will awaken not long after and feed again.  She informed me that, if he does not breast feed for 20 minutes or more, he will awaken an hour later to feed again.  Due to his current gestational age of 55+0 weeks, I encouraged her to continue until closer to term (possibly 38 weeks) to be sure his stamina and ability to feed well continues.  Suggested that he seems to be feeding very well for his age, however, this requires daily evaluation.  He may become more sleepy and/or lose energy.  Suggested mother rest right after pumping to help get more sleep.  She may also pump a little bit less during the night time hours to allow her body to rest more.  However, I encouraged continuing every three hours during the day/evening.  Mother verbalized understanding and can do this.  She would like to get more sleep during the night and would like to try this approach.  Suggested she alert LC if this plan is not working for her after a couple days of trial.  Mother will keep staff informed.  Mother is pumping approximately 90 mls every three hours.  Father present.   Maternal Data    Feeding Feeding Type: Breast Fed  LATCH Score Latch: Grasps breast  easily, tongue down, lips flanged, rhythmical sucking.  Audible Swallowing: Spontaneous and intermittent  Type of Nipple: Everted at rest and after stimulation  Comfort (Breast/Nipple): Soft / non-tender  Hold (Positioning): No assistance needed to correctly position infant at breast.  LATCH Score: 10  Interventions    Lactation Tools Discussed/Used     Consult Status Consult Status: Follow-up Date: 09/10/20 Follow-up type: In-patient    Little Ishikawa 09/09/2020, 4:37 PM

## 2020-09-10 ENCOUNTER — Ambulatory Visit: Payer: Self-pay

## 2020-09-10 NOTE — Lactation Note (Signed)
This note was copied from a baby's chart. Lactation Consultation Note  Patient Name: Boy Aleigha Gilani LOVFI'E Date: 09/10/2020 Reason for consult: Follow-up assessment;NICU baby;Other (Comment);Late-preterm 34-36.6wks  LC to room for f/u visit. Baby to d/c tomorrow, per mom. Mother is bf ad lib and supplementing with 24 calorie fortified breast milk 2 times per day, per infant feeding plan. Mother is pumping 8xday but prefers to pump less as breastfeeding improves. Reviewed pumping to avoid engorgement. D/c teaching provided. Mom to f/u with outpatient LC prn.  Feeding Feeding Type: Breast Fed  LATCH Score Latch: Grasps breast easily, tongue down, lips flanged, rhythmical sucking.  Audible Swallowing: Spontaneous and intermittent  Type of Nipple: Everted at rest and after stimulation  Comfort (Breast/Nipple): Soft / non-tender  Hold (Positioning): No assistance needed to correctly position infant at breast.  LATCH Score: 10  Interventions Interventions: Breast feeding basics reviewed;Skin to skin;DEBP;Expressed milk  Lactation Tools Discussed/Used Tools: Pump;Bottle Breast pump type: Double-Electric Breast Pump   Consult Status Consult Status: Complete Date: 09/10/20 Follow-up type: Call as needed    Gwynne Edinger 09/10/2020, 1:09 PM

## 2022-06-28 ENCOUNTER — Encounter (HOSPITAL_COMMUNITY): Payer: Self-pay | Admitting: Emergency Medicine

## 2022-06-28 ENCOUNTER — Ambulatory Visit (HOSPITAL_COMMUNITY)
Admission: EM | Admit: 2022-06-28 | Discharge: 2022-06-28 | Disposition: A | Discharge status: Home / Self Care | Payer: 59

## 2022-06-28 DIAGNOSIS — M79645 Pain in left finger(s): Secondary | ICD-10-CM | POA: Diagnosis not present

## 2022-06-28 NOTE — ED Provider Notes (Signed)
[place]    CSN: [id number] Arrival date & time: 06/28/22  1434      History   Chief Complaint Chief Complaint  Patient presents with   Finger Swelling    HPI Dawn Dickson is a 36 y.o. female.   Patient presents to urgent care for evaluation of bruising to the right middle finger DIP joint to the palmar aspect of her finger that she first noticed approximately 2 hours ago.  Patient states that she has noticed the area become bruised and swollen.  Area is mildly tender to touch.  Patient has not attempted use of any over-the-counter medications prior to arrival urgent care for her symptoms.  She denies history of rheumatologic/inflammatory disorders.  No recent injury to the right hand/right middle finger, no numbness or tingling to the distal right upper extremity, and no warmth or redness to the area.  No other bruising, swelling, erythema, or warmth reported to any other joints of the right or left hand.  Patient denies fatigue, fever/chills, and decreased range of motion to the right hand/fingers.  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 No other triggering or relieving factors identified for sudden onset bruising to the DIP joint of the right middle finger identified at this time.      Past Medical History:  Diagnosis Date   Postpartum care following vaginal delivery (8/8) 07/01/2016   Rh negative, maternal 09/03/2020   Vaginal bleeding during pregnancy, antepartum 11/10/2014    Patient Active Problem List   Diagnosis Date Noted   Premature rupture of membranes 09/03/2020   Rh negative, maternal 09/03/2020   Postpartum care following vaginal delivery 10/12 07/01/2016   SVD (spontaneous vaginal delivery) PTB 41w1d05/24/2016    Past Surgical History:  Procedure Laterality Date   LEEP  2009   WISDOM TOOTH EXTRACTION      OB History     Gravida  3   Para  3   Term  2   Preterm  1   AB       Living  3      SAB      IAB      Ectopic      Multiple  0   Live Births  3            Home Medications    Prior to Admission medications   Medication Sig Start Date End Date Taking? Authorizing Provider  acetaminophen (TYLENOL) 325 MG tablet Take 2 tablets (650 mg total) by mouth every 4 (four) hours as needed (for pain scale < 4). 07/02/16   PJuliene Pina CNM  coconut oil OIL Apply 1 application topically as needed. 07/02/16   PJuliene Pina CNM  ibuprofen (ADVIL) 600 MG tablet Take 1 tablet (600 mg total) by mouth every 6 (six) hours. 09/05/20   ACharyl Bigger MD  omega-3 acid ethyl esters (LOVAZA) 1 g capsule Take 1 g by mouth daily.    [provider]  Prenat w/o A-FeCbGl-DSS-FA-DHA (CITRANATAL 90 DHA) 90-1 & 300 MG MISC Take 1 tablet by mouth daily.  10/10/14   [provider]    Family History History reviewed. No pertinent family history.  Social History Social History   Tobacco Use   Smoking status: Never   Smokeless tobacco: Never  Vaping Use   Vaping Use: Never used  Substance Use Topics   Alcohol use: No  Drug use: No     Allergies   Patient has no known allergies.   Review of Systems Review of Systems Per HPI  Physical Exam Triage Vital Signs ED Triage Vitals [06/28/22 1504]  Enc Vitals Group     BP 112/75     Pulse Rate 76     Resp 18     Temp 98.8 F (37.1 C)     Temp Source Oral     SpO2 97 %     Weight      Height      Head Circumference      Peak Flow      Pain Score 0     Pain Loc      Pain Edu?      Excl. in [place]?    No data found.  Updated Vital Signs BP 112/75 (BP Location: Right Arm)   Pulse 76   Temp 98.8 F (37.1 C) (Oral)   Resp 18   SpO2 97%   Visual Acuity Right Eye Distance:   Left Eye Distance:   Bilateral Distance:    Right Eye Near:   Left Eye Near:    Bilateral Near:     Physical Exam Vitals and nursing note reviewed.  Constitutional:      Appearance: Normal  appearance. She is not ill-appearing or toxic-appearing.     Comments: Very pleasant patient sitting on exam in position of comfort table in no acute distress.   HENT:     Head: Normocephalic and atraumatic.     Right Ear: Hearing and external ear normal.     Left Ear: Hearing and external ear normal.     Nose: Nose normal.     Mouth/Throat:     Lips: Pink.     Mouth: Mucous membranes are moist.  Eyes:     General: Lids are normal. Vision grossly intact. Gaze aligned appropriately.     Extraocular Movements: Extraocular movements intact.     Conjunctiva/sclera: Conjunctivae normal.  Cardiovascular:     Rate and Rhythm: Normal rate and regular rhythm.     Heart sounds: Normal heart sounds, S1 normal and S2 normal.  Pulmonary:     Effort: Pulmonary effort is normal. No respiratory distress.     Breath sounds: Normal breath sounds and air entry.  Abdominal:     Palpations: Abdomen is soft.  Musculoskeletal:     Cervical back: Neck supple.  Skin:    General: Skin is warm and dry.     Capillary Refill: Capillary refill takes less than 2 seconds.     Findings: Bruising present. No rash.     Comments: Bruising present to the DIP joint of the right middle finger. Capillary refill less than 3 to the affected digit. No tenderness to palpation, warmth, erythema, or obvious swelling to the affected digit. Nail is intact. No obvious sign of injury/trauma to the affected digit. See image below for further detail.   Neurological:     General: No focal deficit present.     Mental Status: She is alert and oriented to person, place, and time. Mental status is at baseline.     Cranial Nerves: No dysarthria or facial asymmetry.     Gait: Gait is intact.  Psychiatric:        Mood and Affect: Mood normal.        Speech: Speech normal.        Behavior: Behavior normal.  Thought Content: Thought content normal.        Judgment: Judgment normal.        UC Treatments / Results  Labs (all  labs ordered are listed, but only abnormal results are displayed) Labs Reviewed - No data to display  EKG   Radiology No results found.  Procedures Procedures (including critical care time)  Medications Ordered in UC Medications - No data to display  Initial Impression / Assessment and Plan / UC Course  I have reviewed the triage vital signs and the nursing notes.  Pertinent labs & imaging results that were available during my care of the patient were reviewed by me and considered in my medical decision making (see chart for details).   1. Pain of finger of left hand Pad of right middle finger appears bruised.  Patient may be used ibuprofen 600 mg every 6 hours with food as needed for pain and inflammation to the pad of the right finger.  Unknown etiology of the bruising and swelling but suspect that patient sustained a mild injury to the finger and bruising will resolve on its own in the next few days.  Patient to return to urgent care if her symptoms become worse.  Stable musculoskeletal exam present today with 5/5 strength against resistance with flexion and extension of the affected digit to the right hand.  No indication for imaging as there was no injury and there is low concern for acute bony abnormality at this time.  Patient agreeable with this plan.  Discussed physical exam and available lab work findings in clinic with patient.  Counseled patient regarding appropriate use of medications and potential side effects for all medications recommended or prescribed today. Discussed red flag signs and symptoms of worsening condition,when to call the PCP office, return to urgent care, and when to seek higher level of care in the emergency department. Patient verbalizes understanding and agreement with plan. All questions answered. Patient discharged in stable condition.  Final Clinical Impressions(s) / UC Diagnoses   Final diagnoses:  Pain of finger of left hand     Discharge  Instructions      You may use ibuprofen as needed for any pain/swelling you experience to your finger. Return to urgent care if symptoms worsen.      ED Prescriptions   None    PDMP not reviewed this encounter.   Talbot Grumbling, [place] 07/01/22 2133

## 2022-06-28 NOTE — ED Triage Notes (Signed)
Pt presents with right middle finger swelling and bruising. States she noticed it today and denies any obvious injuries. States feel like it could be a blood clot.

## 2022-06-28 NOTE — Discharge Instructions (Addendum)
You may use ibuprofen as needed for any pain/swelling you experience to your finger. Return to urgent care if symptoms worsen.

## 2022-07-29 IMAGING — US US MFM OB DETAIL+14 WK
1 series · 14 of 28 positions shown · non-contrast
Comparison: none

[Series 1: us mfm ob detail+14 wk · 72 acquisitions, 14 frames shown]
[im 3/72]
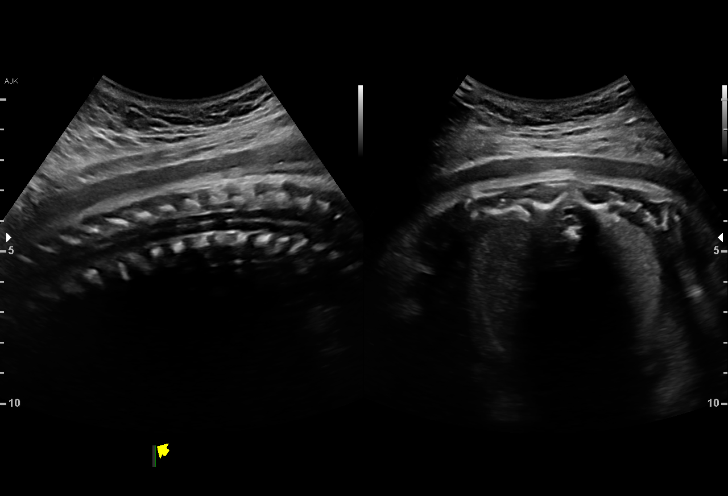
[im 8/72]
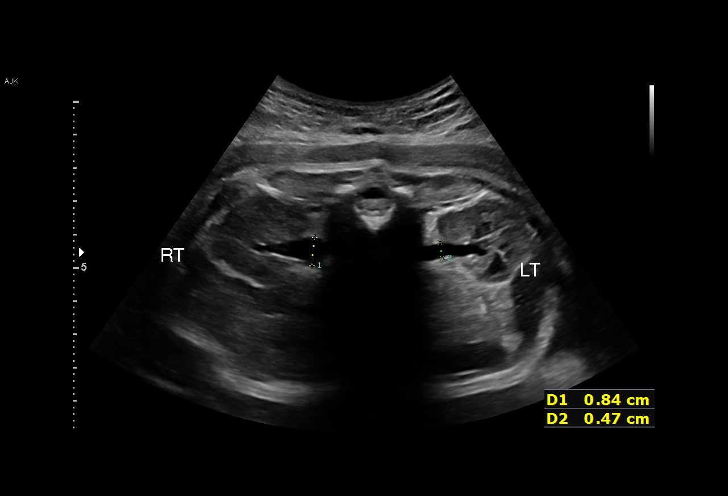
[im 14/72]
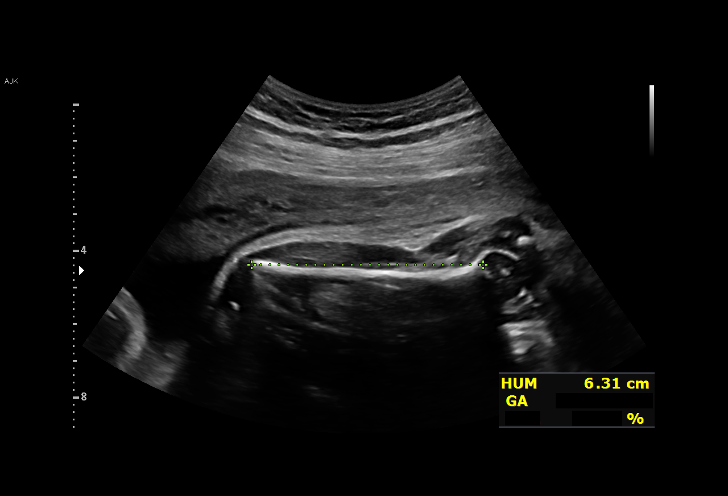
[im 19/72]
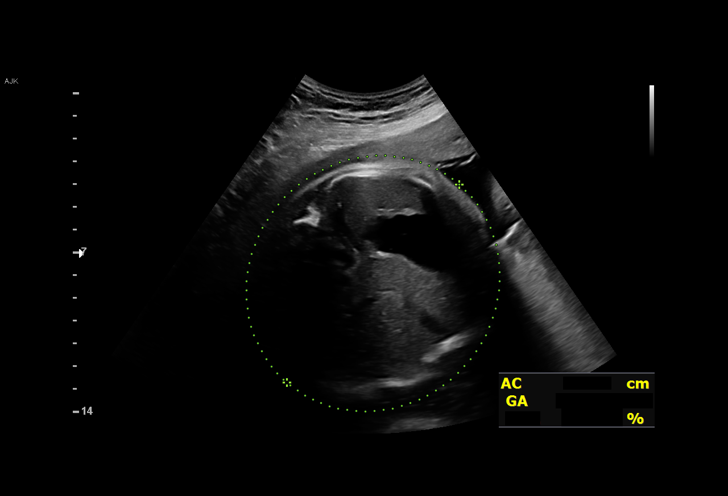
[im 24/72]
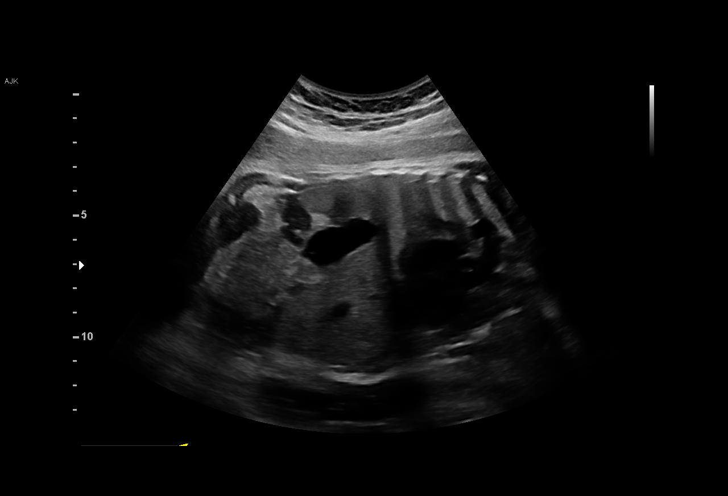
[im 29/72]
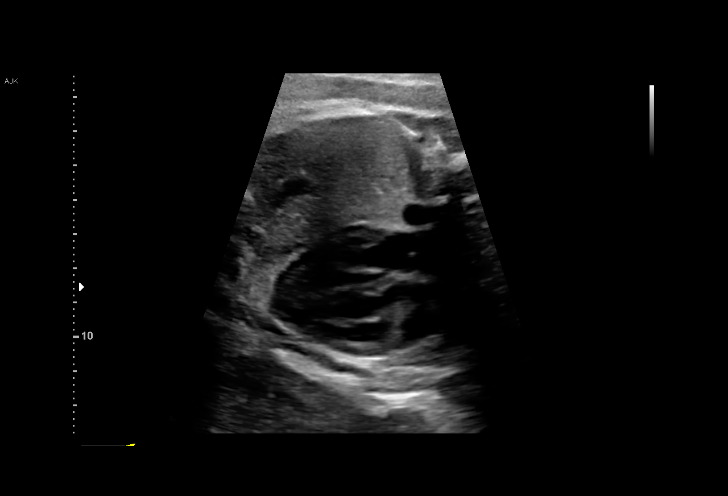
[im 35/72]
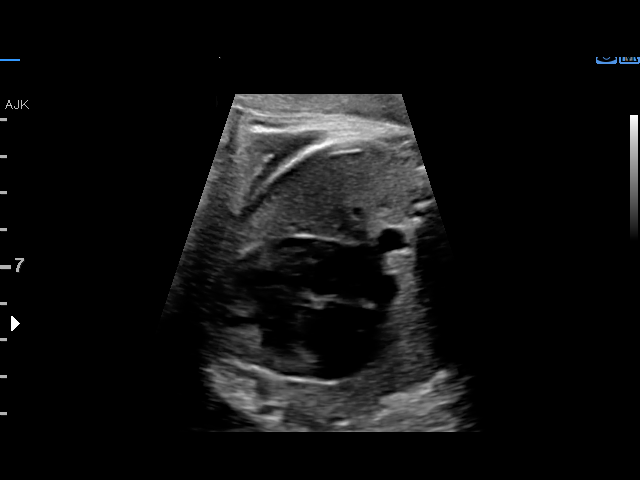
[im 40/72]
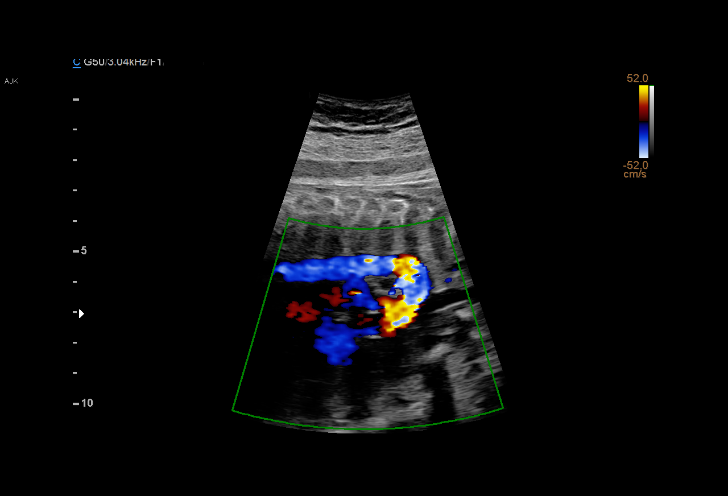
[im 45/72]
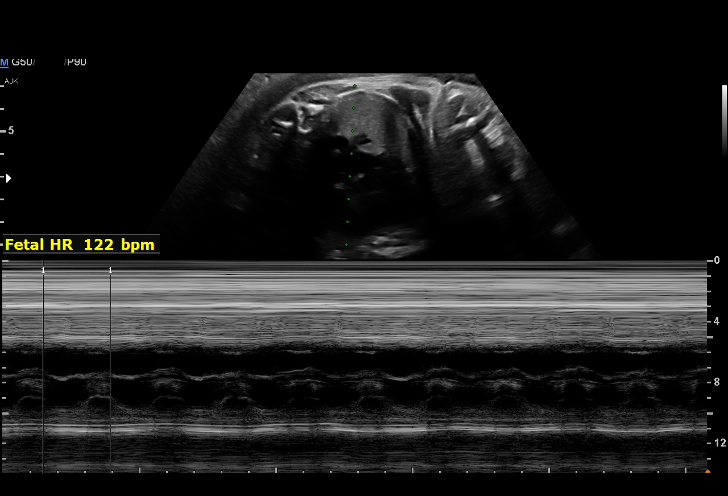
[im 50/72]
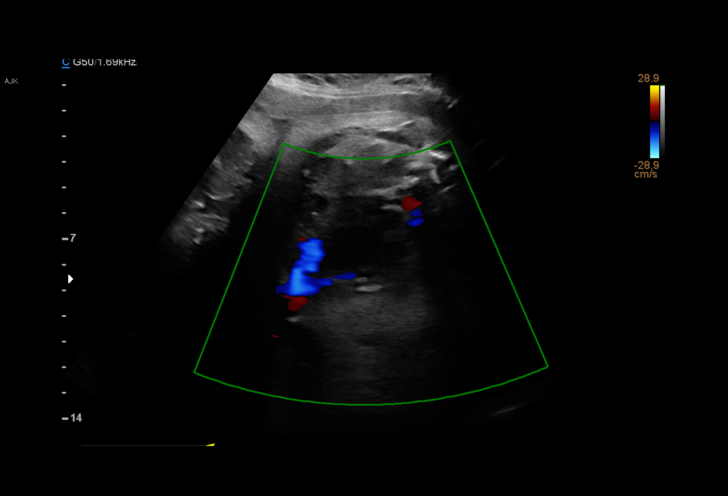
[im 56/72]
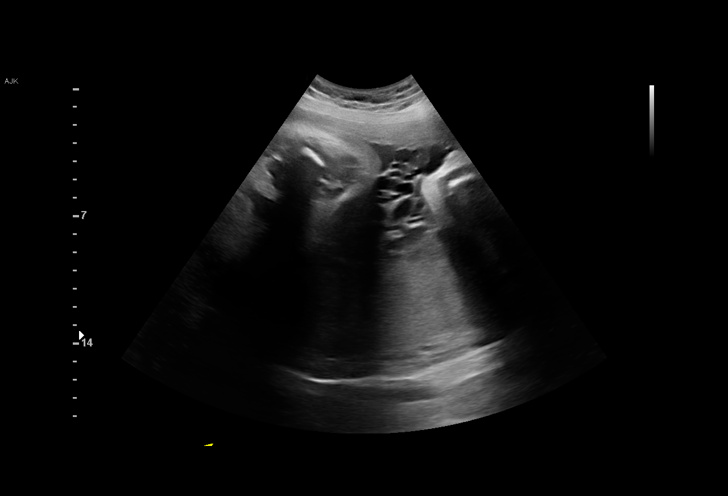
[im 61/72]
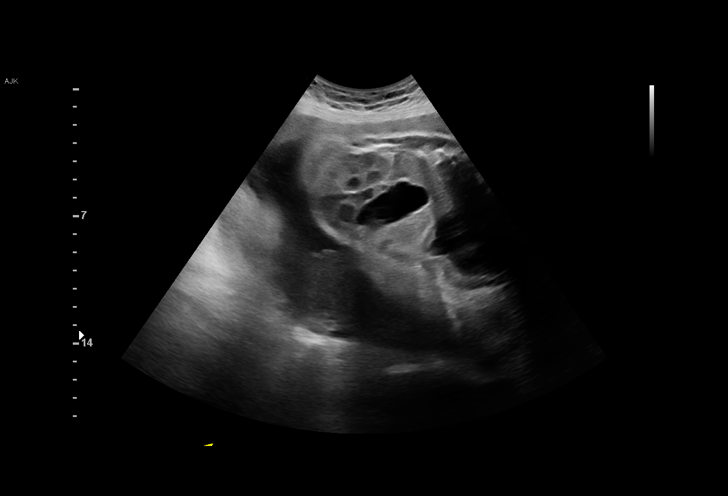
[im 66/72]
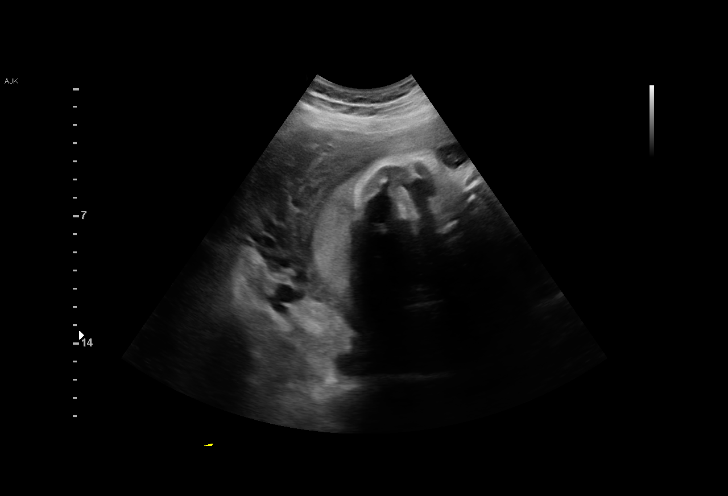
[im 72/72]
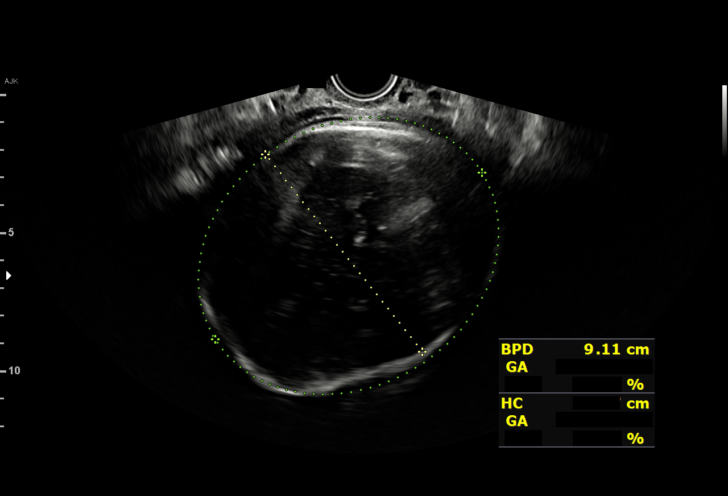

[14 of 28 positions shown; findings below may reference images not displayed]

Indications

 35 weeks gestation of pregnancy
 Premature rupture of membranes - leaking
 fluid
 Encounter for antenatal screening for
 malformations
Vital Signs

 BMI:
Fetal Evaluation

 Num Of Fetuses:         1
 Fetal Heart Rate(bpm):  121
 Cardiac Activity:       Observed
 Presentation:           Cephalic
 Placenta:               Posterior
 P. Cord Insertion:      Visualized

 Amniotic Fluid
 AFI FV:      Within normal limits

 AFI Sum(cm)     %Tile       Largest Pocket(cm)
 13.6            47

 RUQ(cm)       RLQ(cm)       LUQ(cm)        LLQ(cm)

Biometry
 BPD:        91  mm     G. Age:  36w 6d         92  %    CI:        77.51   %    70 - 86
                                                         FL/HC:      21.0   %    20.1 -
 HC:      327.2  mm     G. Age:  37w 1d         67  %    HC/AC:      0.95        0.93 -
 AC:      344.7  mm     G. Age:  38w 3d       > 99  %    FL/BPD:     75.4   %    71 - 87
 FL:       68.6  mm     G. Age:  35w 2d         45  %    FL/AC:      19.9   %    20 - 24
 HUM:      62.7  mm     G. Age:  36w 3d         88  %
 CER:      53.4  mm     G. Age:  37w 6d         93  %

 Est. FW:    4726  gm           7 lb     95  %
OB History

 Gravidity:    3         Term:   2
Gestational Age

 Clinical EDD:  35w 1d                                        EDD:   10/07/20
 U/S Today:     37w 0d                                        EDD:   09/24/20
 Best:          35w 1d     Det. By:  Clinical EDD             EDD:   10/07/20
Anatomy

 Cranium:               Appears normal         Aortic Arch:            Appears normal
 Cavum:                 Not well visualized    Ductal Arch:            Not well visualized
 Ventricles:            Not well visualized    Diaphragm:              Appears normal
 Choroid Plexus:        Appears normal         Stomach:                Appears normal, left
                                                                       sided
 Cerebellum:            Appears normal         Abdomen:                Appears normal
 Posterior Fossa:       Appears normal         Abdominal Wall:         Not well visualized
 Nuchal Fold:           Not applicable (>20    Cord Vessels:           Appears normal (3
                        wks GA)                                        vessel cord)
 Face:                  Not well visualized    Kidneys:                Right sided
                                                                       pyelectasis,
                                                                       mm
 Lips:                  Not well visualized    Bladder:                Appears normal
 Heart:                 Appears normal         Spine:                  Appears normal
                        (4CH, axis, and
                        situs)
 RVOT:                  Appears normal         Upper Extremities:      Not well visualized
 LVOT:                  Appears normal         Lower Extremities:      Appears normal

 Other:  Technically difficult due to advanced GA and fetal position.
Impression

 Ms. Ostora is inpatient for evaluation for preterm rupture of
 membranes.
 Normal anatomy was seen with measurments consistent with
 dates.
 There is right sided renal pyelecatsis of 8.4 mm with and [REDACTED]
 grade 1 appearance.
 Amniotic fluid and fetal movement is normal.
Recommendations

 Follow up growth as clinically indicated.
 Postnatal evaluation of the renal pelvis is recommended.

## 2022-10-18 ENCOUNTER — Encounter (HOSPITAL_COMMUNITY): Payer: Self-pay | Admitting: Emergency Medicine

## 2022-10-18 ENCOUNTER — Ambulatory Visit (HOSPITAL_COMMUNITY)
Admission: EM | Admit: 2022-10-18 | Discharge: 2022-10-18 | Disposition: A | Discharge status: Home / Self Care | Payer: 59 | Attending: Physician Assistant | Admitting: Physician Assistant

## 2022-10-18 DIAGNOSIS — J069 Acute upper respiratory infection, unspecified: Secondary | ICD-10-CM | POA: Diagnosis not present

## 2022-10-18 MED ORDER — PROMETHAZINE-DM 6.25-15 MG/5ML PO SYRP: 5.0000 mL | ORAL_SOLUTION | Freq: Four times a day (QID) | ORAL | 0 refills | Status: AC | PRN

## 2022-10-18 MED ORDER — PREDNISONE 10 MG (21) PO TBPK: ORAL_TABLET | Freq: Every day | ORAL | 0 refills | Status: AC

## 2022-10-18 MED ORDER — BENZONATATE 100 MG PO CAPS: 100.0000 mg | ORAL_CAPSULE | Freq: Three times a day (TID) | ORAL | 0 refills | Status: AC

## 2022-10-18 NOTE — Discharge Instructions (Addendum)
Take Tessalon every 8 hours as needed to suppress cough Can take cough syrup as needed every 6 hours as needed Can take Mucinex (without decongestant as this will be in your cough syrup) and use Flonase once daily Can continue with Tylenol or Ibuprofen as needed for pain Make sure you are taking deep full breath If you develop fever, chills, or night sweats return for evaluation.   If symptoms persists can start steroid dosepak

## 2022-10-18 NOTE — ED Triage Notes (Signed)
Pt c/o persistent cough for 3 weeks. Sometimes productive. C/o right rib pain for a couple days and concerned for fractured ribs. Reports force of cough has caused to vomit.  Taking OTC severe sinus cough and congestion as well as cough drops. Taking tylenol for rib pain.

## 2022-10-18 NOTE — ED Provider Notes (Signed)
[place]    CSN: [id number] Arrival date & time: 10/18/22  1023      History   Chief Complaint Chief Complaint  Patient presents with   Cough    HPI Dawn Dickson is a 36 y.o. female.   Pt complains of persistent cough for the last three weeks.  Reports somewhat productive at times.  She has been taking OTC sinus and cough meds with temporary relief.  She reports over the last few days she has been experiencing right rib pain, concerned about rib fracture. Reports right lower anterior ribs pain is worse with deep breath and movement.  She has been taking tylenol with temporary relief.  Denies fever, chills, sinus pain/pressure, ear pain, sore throat.  Reports kids with similar sx as well.     Past Medical History:  Diagnosis Date   Postpartum care following vaginal delivery (8/8) 07/01/2016   Rh negative, maternal 09/03/2020   Vaginal bleeding during pregnancy, antepartum 11/10/2014    Patient Active Problem List   Diagnosis Date Noted   Premature rupture of membranes 09/03/2020   Rh negative, maternal 09/03/2020   Postpartum care following vaginal delivery 10/12 07/01/2016   SVD (spontaneous vaginal delivery) PTB 32w1d05/24/2016    Past Surgical History:  Procedure Laterality Date   LEEP  2009   WISDOM TOOTH EXTRACTION      OB History     Gravida  3   Para  3   Term  2   Preterm  1   AB      Living  3      SAB      IAB      Ectopic      Multiple  0   Live Births  3            Home Medications    Prior to Admission medications   Medication Sig Start Date End Date Taking? Authorizing Provider  benzonatate (TESSALON) 100 MG capsule Take 1 capsule (100 mg total) by mouth every 8 (eight) hours. 10/18/22  Yes Ward, JLenise Arena PA-C  predniSONE (STERAPRED UNI-PAK 21 TAB) 10 MG (21) TBPK tablet Take by mouth daily. Take 6 tabs by mouth daily  for 2 days, then 5 tabs for 2 days, then 4 tabs for 2 days, then 3 tabs for 2 days, 2  tabs for 2 days, then 1 tab by mouth daily for 2 days 10/18/22  Yes Ward, JLenise Arena PA-C  promethazine-dextromethorphan (PROMETHAZINE-DM) 6.25-15 MG/5ML syrup Take 5 mLs by mouth 4 (four) times daily as needed for cough. 10/18/22  Yes Ward, JLenise Arena PA-C  acetaminophen (TYLENOL) 325 MG tablet Take 2 tablets (650 mg total) by mouth every 4 (four) hours as needed (for pain scale < 4). 07/02/16   PJuliene Pina CNM  coconut oil OIL Apply 1 application topically as needed. 07/02/16   PJuliene Pina CNM  ibuprofen (ADVIL) 600 MG tablet Take 1 tablet (600 mg total) by mouth every 6 (six) hours. 09/05/20   ACharyl Bigger MD  omega-3 acid ethyl esters (LOVAZA) 1 g capsule Take 1 g by mouth daily.    [provider]  Prenat w/o A-FeCbGl-DSS-FA-DHA (CITRANATAL 90 DHA) 90-1 & 300 MG MISC Take 1 tablet by mouth daily.  10/10/14   [provider]    Family History No family history on file.  Social History Social History   Tobacco Use   Smoking status: Never   Smokeless tobacco: Never  Vaping Use   Vaping Use: Never used  Substance Use Topics   Alcohol use: No   Drug use: No     Allergies   Patient has no known allergies.   Review of Systems Review of Systems  Constitutional:  Negative for chills and fever.  HENT:  Positive for congestion. Negative for ear pain and sore throat.   Eyes:  Negative for pain and visual disturbance.  Respiratory:  Positive for cough. Negative for shortness of breath.   Cardiovascular:  Negative for chest pain and palpitations.  Gastrointestinal:  Negative for abdominal pain and vomiting.  Genitourinary:  Negative for dysuria and hematuria.  Musculoskeletal:  Negative for arthralgias and back pain.  Skin:  Negative for color change and rash.  Neurological:  Negative for seizures and syncope.  All other systems reviewed and are negative.    Physical Exam Triage Vital Signs ED Triage Vitals  Enc Vitals Group     BP 10/18/22  1129 117/78     Pulse Rate 10/18/22 1129 77     Resp 10/18/22 1129 16     Temp 10/18/22 1129 99.3 F (37.4 C)     Temp Source 10/18/22 1129 Oral     SpO2 10/18/22 1129 100 %     Weight --      Height --      Head Circumference --      Peak Flow --      Pain Score 10/18/22 1128 8     Pain Loc --      Pain Edu? --      Excl. in [place]? --    No data found.  Updated Vital Signs BP 117/78 (BP Location: Left Arm)   Pulse 77   Temp 99.3 F (37.4 C) (Oral)   Resp 16   LMP 10/04/2022 (Approximate)   SpO2 100%   Visual Acuity Right Eye Distance:   Left Eye Distance:   Bilateral Distance:    Right Eye Near:   Left Eye Near:    Bilateral Near:     Physical Exam Vitals and nursing note reviewed.  Constitutional:      General: She is not in acute distress.    Appearance: She is well-developed.  HENT:     Head: Normocephalic and atraumatic.  Eyes:     Conjunctiva/sclera: Conjunctivae normal.  Cardiovascular:     Rate and Rhythm: Normal rate and regular rhythm.     Heart sounds: No murmur heard. Pulmonary:     Effort: Pulmonary effort is normal. No respiratory distress.     Breath sounds: Normal breath sounds.  Chest:    Abdominal:     Palpations: Abdomen is soft.     Tenderness: There is no abdominal tenderness.  Musculoskeletal:        General: No swelling.     Cervical back: Neck supple.  Skin:    General: Skin is warm and dry.     Capillary Refill: Capillary refill takes less than 2 seconds.  Neurological:     Mental Status: She is alert.  Psychiatric:        Mood and Affect: Mood normal.      UC Treatments / Results  Labs (all labs ordered are listed, but only abnormal results are displayed) Labs Reviewed - No data to display  EKG   Radiology No results found.  Procedures Procedures (including critical care time)  Medications Ordered in UC Medications - No data to display  Initial Impression / Assessment  and Plan / UC Course  I have reviewed  the triage vital signs and the nursing notes.  Pertinent labs & imaging results that were available during my care of the patient were reviewed by me and considered in my medical decision making (see chart for details).     Viral URI with cough.  Vitals wnl, lungs clear to auscultation, no need for chest xray at this time.  Will prescribe tessalon, cough syrup and advise pt to take mucinex and use flonase.  Will send in prednisone taper to begin if no improvement.  No antibiotics indicated at this time, no concern for sinus infection, no concern for pneumonia.  Return precautions discussed.   Rib pain, possible fracture vs. Muscle strain.  Discussed treatment plan is the same, no chest xray indicated.  Advised tylenol or ibuprofen as needed.  Deep breathing.   Return precautions discussed.  Final Clinical Impressions(s) / UC Diagnoses   Final diagnoses:  Viral URI with cough     Discharge Instructions      Take Tessalon every 8 hours as needed to suppress cough Can take cough syrup as needed every 6 hours as needed Can take Mucinex (without decongestant as this will be in your cough syrup) and use Flonase once daily Can continue with Tylenol or Ibuprofen as needed for pain Make sure you are taking deep full breath If you develop fever, chills, or night sweats return for evaluation.   If symptoms persists can start steroid dosepak   ED Prescriptions     Medication Sig Dispense Auth. Provider   benzonatate (TESSALON) 100 MG capsule Take 1 capsule (100 mg total) by mouth every 8 (eight) hours. 21 capsule Ward, Lenise Arena, PA-C   promethazine-dextromethorphan (PROMETHAZINE-DM) 6.25-15 MG/5ML syrup Take 5 mLs by mouth 4 (four) times daily as needed for cough. 118 mL Ward, Lenise Arena, PA-C   predniSONE (STERAPRED UNI-PAK 21 TAB) 10 MG (21) TBPK tablet Take by mouth daily. Take 6 tabs by mouth daily  for 2 days, then 5 tabs for 2 days, then 4 tabs for 2 days, then 3 tabs for 2 days, 2 tabs  for 2 days, then 1 tab by mouth daily for 2 days 42 tablet Ward, Lenise Arena, PA-C      PDMP not reviewed this encounter.   Ward, Lenise Arena, PA-C 10/18/22 1358

## 2024-02-01 ENCOUNTER — Other Ambulatory Visit: Payer: Self-pay | Admitting: Sports Medicine

## 2024-02-01 ENCOUNTER — Ambulatory Visit
Admission: RE | Admit: 2024-02-01 | Discharge: 2024-02-01 | Disposition: A | Discharge status: Home / Self Care | Source: Ambulatory Visit | Attending: Sports Medicine | Admitting: Sports Medicine

## 2024-02-01 DIAGNOSIS — M25571 Pain in right ankle and joints of right foot: Secondary | ICD-10-CM
